# Patient Record
Sex: Female | Born: 1985 | Race: White | Hispanic: No | Marital: Single | Smoking: Never smoker
Health system: Southern US, Community
[De-identification: ages and names within clinical notes are randomized; demographics above are authoritative.]

## PROBLEM LIST (undated history)

## (undated) ENCOUNTER — Inpatient Hospital Stay: Payer: Self-pay

## (undated) DIAGNOSIS — F419 Anxiety disorder, unspecified: Secondary | ICD-10-CM

## (undated) HISTORY — PX: CHOLECYSTECTOMY: SHX55

---

## 2006-12-02 ENCOUNTER — Emergency Department: Payer: Self-pay | Admitting: Emergency Medicine

## 2007-01-28 ENCOUNTER — Emergency Department: Payer: Self-pay | Admitting: Emergency Medicine

## 2007-01-28 ENCOUNTER — Other Ambulatory Visit: Payer: Self-pay

## 2007-03-05 ENCOUNTER — Emergency Department: Payer: Self-pay

## 2009-01-27 ENCOUNTER — Emergency Department: Payer: Self-pay | Admitting: Emergency Medicine

## 2009-04-26 ENCOUNTER — Emergency Department: Payer: Self-pay | Admitting: Emergency Medicine

## 2009-04-29 ENCOUNTER — Emergency Department: Payer: Self-pay | Admitting: Internal Medicine

## 2010-08-14 ENCOUNTER — Emergency Department: Payer: Self-pay | Admitting: Emergency Medicine

## 2011-04-14 ENCOUNTER — Ambulatory Visit: Payer: Self-pay | Admitting: Family Medicine

## 2011-11-11 ENCOUNTER — Emergency Department: Payer: Self-pay | Admitting: Unknown Physician Specialty

## 2011-12-23 ENCOUNTER — Emergency Department: Payer: Self-pay | Admitting: Emergency Medicine

## 2012-03-15 ENCOUNTER — Emergency Department: Payer: Self-pay | Admitting: Emergency Medicine

## 2012-03-15 LAB — CBC
HGB: 13.5 g/dL (ref 12.0–16.0)
MCHC: 34.5 g/dL (ref 32.0–36.0)
MCV: 88 fL (ref 80–100)
Platelet: 427 10*3/uL (ref 150–440)
RBC: 4.42 10*6/uL (ref 3.80–5.20)
RDW: 12.4 % (ref 11.5–14.5)
WBC: 18.1 10*3/uL — ABNORMAL HIGH (ref 3.6–11.0)

## 2012-03-15 LAB — URINALYSIS, COMPLETE
Ketone: NEGATIVE
Nitrite: NEGATIVE
Specific Gravity: 1.013 (ref 1.003–1.030)
Squamous Epithelial: 45
WBC UR: 54 /HPF (ref 0–5)

## 2012-03-15 LAB — PREGNANCY, URINE: Pregnancy Test, Urine: POSITIVE m[IU]/mL

## 2012-03-15 LAB — COMPREHENSIVE METABOLIC PANEL
BUN: 5 mg/dL — ABNORMAL LOW (ref 7–18)
Bilirubin,Total: 0.7 mg/dL (ref 0.2–1.0)
Calcium, Total: 9.5 mg/dL (ref 8.5–10.1)
Chloride: 103 mmol/L (ref 98–107)
Creatinine: 0.54 mg/dL — ABNORMAL LOW (ref 0.60–1.30)
EGFR (African American): >60
EGFR (Non-African Amer.): >60
Osmolality: 271 (ref 275–301)

## 2012-03-15 LAB — HCG, QUANTITATIVE, PREGNANCY: Beta Hcg, Quant.: 97819 m[IU]/mL — ABNORMAL HIGH

## 2012-04-28 ENCOUNTER — Emergency Department: Payer: Self-pay | Admitting: *Deleted

## 2012-04-28 LAB — URINALYSIS, COMPLETE
Ketone: NEGATIVE
Nitrite: NEGATIVE
Ph: 8 (ref 4.5–8.0)
Protein: 30
RBC,UR: 3 /HPF (ref 0–5)
Specific Gravity: 1.019 (ref 1.003–1.030)
WBC UR: 78 /HPF (ref 0–5)

## 2012-04-29 LAB — CBC
HGB: 11.7 g/dL — ABNORMAL LOW (ref 12.0–16.0)
MCH: 30.4 pg (ref 26.0–34.0)
RDW: 12.5 % (ref 11.5–14.5)

## 2012-04-29 LAB — WET PREP, GENITAL

## 2012-04-30 LAB — URINE CULTURE

## 2012-05-03 ENCOUNTER — Emergency Department: Payer: Self-pay | Admitting: Emergency Medicine

## 2012-05-03 LAB — URINALYSIS, COMPLETE
Bacteria: NONE SEEN
Bilirubin,UR: NEGATIVE
Blood: NEGATIVE
Glucose,UR: NEGATIVE mg/dL (ref 0–75)
Leukocyte Esterase: NEGATIVE
Nitrite: NEGATIVE
Ph: 8 (ref 4.5–8.0)
Protein: 30
RBC,UR: 1 /HPF (ref 0–5)
Specific Gravity: 1.023 (ref 1.003–1.030)
Squamous Epithelial: NONE SEEN

## 2012-05-03 LAB — CBC
HCT: 36.8 % (ref 35.0–47.0)
MCH: 29.9 pg (ref 26.0–34.0)
MCHC: 33.3 g/dL (ref 32.0–36.0)
MCV: 90 fL (ref 80–100)
Platelet: 331 10*3/uL (ref 150–440)
RBC: 4.09 10*6/uL (ref 3.80–5.20)
RDW: 12.6 % (ref 11.5–14.5)
WBC: 27.9 10*3/uL — ABNORMAL HIGH (ref 3.6–11.0)

## 2012-05-03 LAB — COMPREHENSIVE METABOLIC PANEL
Alkaline Phosphatase: 101 U/L (ref 50–136)
Anion Gap: 11 (ref 7–16)
BUN: 5 mg/dL — ABNORMAL LOW (ref 7–18)
Chloride: 102 mmol/L (ref 98–107)
Glucose: 112 mg/dL — ABNORMAL HIGH (ref 65–99)
Osmolality: 274 (ref 275–301)
Potassium: 3.1 mmol/L — ABNORMAL LOW (ref 3.5–5.1)
SGOT(AST): 17 U/L (ref 15–37)
Sodium: 138 mmol/L (ref 136–145)
Total Protein: 7.9 g/dL (ref 6.4–8.2)

## 2012-05-03 LAB — HCG, QUANTITATIVE, PREGNANCY: Beta Hcg, Quant.: 22082 m[IU]/mL — ABNORMAL HIGH

## 2012-05-05 LAB — PATHOLOGY REPORT

## 2013-12-12 ENCOUNTER — Emergency Department: Payer: Self-pay | Admitting: Emergency Medicine

## 2014-02-17 ENCOUNTER — Emergency Department: Payer: Self-pay | Admitting: Emergency Medicine

## 2014-02-17 LAB — CBC WITH DIFFERENTIAL/PLATELET
Basophil #: 0.1 10*3/uL (ref 0.0–0.1)
Basophil %: 0.9 %
EOS ABS: 0.1 10*3/uL (ref 0.0–0.7)
Eosinophil %: 0.6 %
HCT: 38.8 % (ref 35.0–47.0)
HGB: 13.3 g/dL (ref 12.0–16.0)
LYMPHS ABS: 2.9 10*3/uL (ref 1.0–3.6)
Lymphocyte %: 18.6 %
MCH: 29.7 pg (ref 26.0–34.0)
MCHC: 34.2 g/dL (ref 32.0–36.0)
MCV: 87 fL (ref 80–100)
MONO ABS: 0.8 x10 3/mm (ref 0.2–0.9)
Monocyte %: 4.9 %
NEUTROS ABS: 11.6 10*3/uL — AB (ref 1.4–6.5)
NEUTROS PCT: 75 %
PLATELETS: 407 10*3/uL (ref 150–440)
RBC: 4.48 10*6/uL (ref 3.80–5.20)
RDW: 13 % (ref 11.5–14.5)
WBC: 15.4 10*3/uL — AB (ref 3.6–11.0)

## 2014-02-17 LAB — URINALYSIS, COMPLETE
Bacteria: NONE SEEN
Bilirubin,UR: NEGATIVE
GLUCOSE, UR: NEGATIVE mg/dL (ref 0–75)
Ketone: NEGATIVE
Leukocyte Esterase: NEGATIVE
NITRITE: NEGATIVE
Ph: 6 (ref 4.5–8.0)
Protein: NEGATIVE
RBC,UR: 1 /HPF (ref 0–5)
Specific Gravity: 1.019 (ref 1.003–1.030)
WBC UR: 3 /HPF (ref 0–5)

## 2014-02-17 LAB — PREGNANCY, URINE: Pregnancy Test, Urine: NEGATIVE m[IU]/mL

## 2014-02-17 LAB — BASIC METABOLIC PANEL
Anion Gap: 5 — ABNORMAL LOW (ref 7–16)
BUN: 8 mg/dL (ref 7–18)
CO2: 30 mmol/L (ref 21–32)
CREATININE: 0.71 mg/dL (ref 0.60–1.30)
Calcium, Total: 9 mg/dL (ref 8.5–10.1)
Chloride: 104 mmol/L (ref 98–107)
EGFR (African American): >60
EGFR (Non-African Amer.): >60
Glucose: 107 mg/dL — ABNORMAL HIGH (ref 65–99)
OSMOLALITY: 276 (ref 275–301)
POTASSIUM: 3.5 mmol/L (ref 3.5–5.1)
Sodium: 139 mmol/L (ref 136–145)

## 2014-02-17 LAB — GC/CHLAMYDIA PROBE AMP

## 2014-02-17 LAB — WET PREP, GENITAL

## 2014-04-13 ENCOUNTER — Emergency Department: Payer: Self-pay | Admitting: Emergency Medicine

## 2014-06-26 ENCOUNTER — Emergency Department: Payer: Self-pay | Admitting: Emergency Medicine

## 2014-06-26 LAB — URINALYSIS, COMPLETE
BACTERIA: NONE SEEN
BILIRUBIN, UR: NEGATIVE
Blood: NEGATIVE
Glucose,UR: NEGATIVE mg/dL (ref 0–75)
Ketone: NEGATIVE
Leukocyte Esterase: NEGATIVE
Nitrite: NEGATIVE
PH: 5 (ref 4.5–8.0)
Protein: 30
RBC,UR: 2 /HPF (ref 0–5)
Specific Gravity: 1.035 (ref 1.003–1.030)
Squamous Epithelial: 17
WBC UR: 1 /HPF (ref 0–5)

## 2014-06-28 LAB — URINE CULTURE

## 2014-06-29 ENCOUNTER — Emergency Department: Payer: Self-pay | Admitting: Emergency Medicine

## 2014-06-29 LAB — COMPREHENSIVE METABOLIC PANEL
ALBUMIN: 3.7 g/dL (ref 3.4–5.0)
ALK PHOS: 64 U/L
ANION GAP: 10 (ref 7–16)
BUN: 8 mg/dL (ref 7–18)
Bilirubin,Total: 0.7 mg/dL (ref 0.2–1.0)
CALCIUM: 9.7 mg/dL (ref 8.5–10.1)
CHLORIDE: 103 mmol/L (ref 98–107)
Co2: 23 mmol/L (ref 21–32)
Creatinine: 0.65 mg/dL (ref 0.60–1.30)
EGFR (African American): >60
GLUCOSE: 100 mg/dL — AB (ref 65–99)
OSMOLALITY: 270 (ref 275–301)
Potassium: 3.5 mmol/L (ref 3.5–5.1)
SGOT(AST): 14 U/L — ABNORMAL LOW (ref 15–37)
SGPT (ALT): 20 U/L
Sodium: 136 mmol/L (ref 136–145)
TOTAL PROTEIN: 8 g/dL (ref 6.4–8.2)

## 2014-06-29 LAB — URINALYSIS, COMPLETE
BILIRUBIN, UR: NEGATIVE
Blood: NEGATIVE
Glucose,UR: NEGATIVE mg/dL (ref 0–75)
Nitrite: NEGATIVE
PH: 7 (ref 4.5–8.0)
RBC,UR: 5 /HPF (ref 0–5)
SPECIFIC GRAVITY: 1.025 (ref 1.003–1.030)

## 2014-06-29 LAB — CBC
HCT: 37.5 % (ref 35.0–47.0)
HGB: 12.8 g/dL (ref 12.0–16.0)
MCH: 30 pg (ref 26.0–34.0)
MCHC: 34 g/dL (ref 32.0–36.0)
MCV: 88 fL (ref 80–100)
PLATELETS: 393 10*3/uL (ref 150–440)
RBC: 4.25 10*6/uL (ref 3.80–5.20)
RDW: 12.8 % (ref 11.5–14.5)
WBC: 15.5 10*3/uL — AB (ref 3.6–11.0)

## 2014-06-29 LAB — GC/CHLAMYDIA PROBE AMP

## 2014-06-29 LAB — WET PREP, GENITAL

## 2014-06-29 LAB — LIPASE, BLOOD: Lipase: 110 U/L (ref 73–393)

## 2014-06-29 LAB — HCG, QUANTITATIVE, PREGNANCY: BETA HCG, QUANT.: 13907 m[IU]/mL — AB

## 2014-07-01 LAB — URINE CULTURE

## 2014-07-23 ENCOUNTER — Emergency Department: Payer: Self-pay | Admitting: Emergency Medicine

## 2014-07-23 ENCOUNTER — Observation Stay: Payer: Self-pay

## 2014-07-23 LAB — COMPREHENSIVE METABOLIC PANEL
ALK PHOS: 70 U/L
ALK PHOS: 68 U/L
ALT: 17 U/L
AST: 13 U/L — AB (ref 15–37)
AST: 30 U/L (ref 15–37)
Albumin: 3.6 g/dL (ref 3.4–5.0)
Albumin: 3.6 g/dL (ref 3.4–5.0)
Anion Gap: 11 (ref 7–16)
Anion Gap: 14 (ref 7–16)
BUN: 6 mg/dL — ABNORMAL LOW (ref 7–18)
BUN: 9 mg/dL (ref 7–18)
Bilirubin,Total: 1 mg/dL (ref 0.2–1.0)
Bilirubin,Total: 1.2 mg/dL — ABNORMAL HIGH (ref 0.2–1.0)
CALCIUM: 9.6 mg/dL (ref 8.5–10.1)
CHLORIDE: 101 mmol/L (ref 98–107)
CHLORIDE: 101 mmol/L (ref 98–107)
CO2: 19 mmol/L — AB (ref 21–32)
CO2: 19 mmol/L — AB (ref 21–32)
Calcium, Total: 9.7 mg/dL (ref 8.5–10.1)
Creatinine: 0.47 mg/dL — ABNORMAL LOW (ref 0.60–1.30)
Creatinine: 0.66 mg/dL (ref 0.60–1.30)
EGFR (African American): >60
EGFR (Non-African Amer.): >60
GLUCOSE: 104 mg/dL — AB (ref 65–99)
Glucose: 103 mg/dL — ABNORMAL HIGH (ref 65–99)
OSMOLALITY: 261 (ref 275–301)
Osmolality: 267 (ref 275–301)
POTASSIUM: 3.6 mmol/L (ref 3.5–5.1)
POTASSIUM: 2.8 mmol/L — AB (ref 3.5–5.1)
SGPT (ALT): 17 U/L
Sodium: 131 mmol/L — ABNORMAL LOW (ref 136–145)
Sodium: 134 mmol/L — ABNORMAL LOW (ref 136–145)
TOTAL PROTEIN: 8.2 g/dL (ref 6.4–8.2)
Total Protein: 8.2 g/dL (ref 6.4–8.2)

## 2014-07-23 LAB — URINALYSIS, COMPLETE
Bacteria: NONE SEEN
GLUCOSE, UR: NEGATIVE mg/dL (ref 0–75)
Nitrite: NEGATIVE
Ph: 6 (ref 4.5–8.0)
SPECIFIC GRAVITY: 1.031 (ref 1.003–1.030)
WBC UR: 6 /HPF (ref 0–5)

## 2014-07-23 LAB — CBC
HCT: 39.5 % (ref 35.0–47.0)
HGB: 13.2 g/dL (ref 12.0–16.0)
MCH: 29.8 pg (ref 26.0–34.0)
MCHC: 33.3 g/dL (ref 32.0–36.0)
MCV: 90 fL (ref 80–100)
Platelet: 422 10*3/uL (ref 150–440)
RBC: 4.42 10*6/uL (ref 3.80–5.20)
RDW: 13.1 % (ref 11.5–14.5)
WBC: 18.1 10*3/uL — AB (ref 3.6–11.0)

## 2014-07-23 LAB — CBC WITH DIFFERENTIAL/PLATELET
BASOS PCT: 0.3 %
Basophil #: 0 10*3/uL (ref 0.0–0.1)
Eosinophil #: 0 10*3/uL (ref 0.0–0.7)
Eosinophil %: 0.1 %
HCT: 37.7 % (ref 35.0–47.0)
HGB: 12.8 g/dL (ref 12.0–16.0)
LYMPHS ABS: 0.8 10*3/uL — AB (ref 1.0–3.6)
Lymphocyte %: 4.6 %
MCH: 30.2 pg (ref 26.0–34.0)
MCHC: 33.8 g/dL (ref 32.0–36.0)
MCV: 89 fL (ref 80–100)
Monocyte #: 0.4 x10 3/mm (ref 0.2–0.9)
Monocyte %: 2.2 %
Neutrophil #: 15.3 10*3/uL — ABNORMAL HIGH (ref 1.4–6.5)
Neutrophil %: 92.8 %
Platelet: 398 10*3/uL (ref 150–440)
RBC: 4.23 10*6/uL (ref 3.80–5.20)
RDW: 12.7 % (ref 11.5–14.5)
WBC: 16.5 10*3/uL — ABNORMAL HIGH (ref 3.6–11.0)

## 2014-07-23 LAB — HCG, QUANTITATIVE, PREGNANCY: Beta Hcg, Quant.: 117853 m[IU]/mL — ABNORMAL HIGH

## 2014-07-24 LAB — ELECTROLYTE PANEL
Anion Gap: 12 (ref 7–16)
Chloride: 107 mmol/L (ref 98–107)
Co2: 16 mmol/L — ABNORMAL LOW (ref 21–32)
Potassium: 3.5 mmol/L (ref 3.5–5.1)
Sodium: 135 mmol/L — ABNORMAL LOW (ref 136–145)

## 2014-07-24 LAB — TSH: Thyroid Stimulating Horm: 0.149 u[IU]/mL — ABNORMAL LOW

## 2014-07-24 LAB — BUN: BUN: 5 mg/dL — ABNORMAL LOW (ref 7–18)

## 2014-07-24 LAB — CREATININE, SERUM
CREATININE: 0.49 mg/dL — AB (ref 0.60–1.30)
EGFR (African American): >60

## 2014-07-24 LAB — T4, FREE: Free Thyroxine: 1.45 ng/dL

## 2014-12-03 ENCOUNTER — Emergency Department: Payer: Self-pay | Admitting: Emergency Medicine

## 2014-12-12 ENCOUNTER — Emergency Department: Payer: Self-pay | Admitting: Student

## 2015-01-21 ENCOUNTER — Emergency Department: Payer: Self-pay | Admitting: Emergency Medicine

## 2015-02-20 ENCOUNTER — Emergency Department
Admit: 2015-02-20 | Disposition: A | Discharge status: Home / Self Care | Payer: Self-pay | Admitting: Emergency Medicine

## 2015-03-08 ENCOUNTER — Emergency Department
Admit: 2015-03-08 | Disposition: A | Discharge status: Home / Self Care | Payer: Self-pay | Admitting: Emergency Medicine

## 2015-04-14 ENCOUNTER — Encounter: Payer: Self-pay | Admitting: Urgent Care

## 2015-04-14 DIAGNOSIS — O200 Threatened abortion: Secondary | ICD-10-CM | POA: Insufficient documentation

## 2015-04-14 DIAGNOSIS — Z3A00 Weeks of gestation of pregnancy not specified: Secondary | ICD-10-CM | POA: Insufficient documentation

## 2015-04-14 LAB — URINALYSIS COMPLETE WITH MICROSCOPIC (ARMC ONLY)
Bacteria, UA: NONE SEEN
Bilirubin Urine: NEGATIVE
GLUCOSE, UA: NEGATIVE mg/dL
Nitrite: NEGATIVE
PROTEIN: NEGATIVE mg/dL
SPECIFIC GRAVITY, URINE: 1.026 (ref 1.005–1.030)
pH: 6 (ref 5.0–8.0)

## 2015-04-14 LAB — ABO/RH
ABO/RH(D): A POS
ABO/RH(D): A POS

## 2015-04-14 LAB — CBC
HEMATOCRIT: 38.1 % (ref 35.0–47.0)
Hemoglobin: 13 g/dL (ref 12.0–16.0)
MCH: 30.3 pg (ref 26.0–34.0)
MCHC: 34.1 g/dL (ref 32.0–36.0)
MCV: 88.7 fL (ref 80.0–100.0)
PLATELETS: 370 10*3/uL (ref 150–440)
RBC: 4.3 MIL/uL (ref 3.80–5.20)
RDW: 12.7 % (ref 11.5–14.5)
WBC: 13.4 10*3/uL — ABNORMAL HIGH (ref 3.6–11.0)

## 2015-04-14 LAB — POCT PREGNANCY, URINE: PREG TEST UR: POSITIVE — AB

## 2015-04-14 NOTE — ED Notes (Signed)
Patient presents with c/o vaginal bleeding since around 1800 tonight. Patient with recent (+) home preg test. (+) diffuse abd cramping with (+) N/V reported.

## 2015-04-15 ENCOUNTER — Emergency Department: Payer: Self-pay

## 2015-04-15 ENCOUNTER — Emergency Department
Admission: EM | Admit: 2015-04-15 | Discharge: 2015-04-15 | Disposition: A | Discharge status: Home / Self Care | Payer: Self-pay | Attending: Emergency Medicine | Admitting: Emergency Medicine

## 2015-04-15 DIAGNOSIS — O200 Threatened abortion: Secondary | ICD-10-CM

## 2015-04-15 DIAGNOSIS — Z8742 Personal history of other diseases of the female genital tract: Secondary | ICD-10-CM

## 2015-04-15 DIAGNOSIS — O020 Blighted ovum and nonhydatidiform mole: Secondary | ICD-10-CM

## 2015-04-15 LAB — CHLAMYDIA/NGC RT PCR (ARMC ONLY)
Chlamydia Tr: NOT DETECTED
N gonorrhoeae: NOT DETECTED

## 2015-04-15 LAB — WET PREP, GENITAL
Clue Cells Wet Prep HPF POC: NONE SEEN
Trich, Wet Prep: NONE SEEN
YEAST WET PREP: NONE SEEN

## 2015-04-15 LAB — HCG, QUANTITATIVE, PREGNANCY: HCG, BETA CHAIN, QUANT, S: 12207 m[IU]/mL — AB (ref <5)

## 2015-04-15 NOTE — ED Notes (Signed)
Pt reports vag bleeding x 9 hours with abd cramping and positive pregnancy test.  Pt reports vag bleeding light.  Pt's cramping intermittent.  Pt NAD at this time.

## 2015-04-15 NOTE — Discharge Instructions (Signed)
Threatened Miscarriage A threatened miscarriage occurs when you have vaginal bleeding during your first 20 weeks of pregnancy but the pregnancy has not ended. If you have vaginal bleeding during this time, your health care provider will do tests to make sure you are still pregnant. If the tests show you are still pregnant and the developing baby (fetus) inside your womb (uterus) is still growing, your condition is considered a threatened miscarriage. A threatened miscarriage does not mean your pregnancy will end, but it does increase the risk of losing your pregnancy (complete miscarriage). CAUSES  The cause of a threatened miscarriage is usually not known. If you go on to have a complete miscarriage, the most common cause is an abnormal number of chromosomes in the developing baby. Chromosomes are the structures inside cells that hold all your genetic material. Some causes of vaginal bleeding that do not result in miscarriage include:  Having sex.  Having an infection.  Normal hormone changes of pregnancy.  Bleeding that occurs when an egg implants in your uterus. RISK FACTORS Risk factors for bleeding in early pregnancy include:  Obesity.  Smoking.  Drinking excessive amounts of alcohol or caffeine.  Recreational drug use. SIGNS AND SYMPTOMS  Light vaginal bleeding.  Mild abdominal pain or cramps. DIAGNOSIS  If you have bleeding with or without abdominal pain before 20 weeks of pregnancy, your health care provider will do tests to check whether you are still pregnant. One important test involves using sound waves and a computer (ultrasound) to create images of the inside of your uterus. Other tests include an internal exam of your vagina and uterus (pelvic exam) and measurement of your baby's heart rate.  You may be diagnosed with a threatened miscarriage if:  Ultrasound testing shows you are still pregnant.  Your baby's heart rate is strong.  A pelvic exam shows that the  opening between your uterus and your vagina (cervix) is closed.  Your heart rate and blood pressure are stable.  Blood tests confirm you are still pregnant. TREATMENT  No treatments have been shown to prevent a threatened miscarriage from going on to a complete miscarriage. However, the right home care is important.  HOME CARE INSTRUCTIONS   Make sure you keep all your appointments for prenatal care. This is very important.  Get plenty of rest.  Do not have sex or use tampons if you have vaginal bleeding.  Do not douche.  Do not smoke or use recreational drugs.  Do not drink alcohol.  Avoid caffeine. SEEK MEDICAL CARE IF:  You have light vaginal bleeding or spotting while pregnant.  You have abdominal pain or cramping.  You have a fever. SEEK IMMEDIATE MEDICAL CARE IF:  You have heavy vaginal bleeding.  You have blood clots coming from your vagina.  You have severe low back pain or abdominal cramps.  You have fever, chills, and severe abdominal pain. MAKE SURE YOU:  Understand these instructions.  Will watch your condition.  Will get help right away if you are not doing well or get worse. Document Released: 11/08/2005 Document Revised: 11/13/2013 Document Reviewed: 09/04/2013 [facility] Patient Information 2015 [place], [place]. This information is not intended to replace advice given to you by your health care provider. Make sure you discuss any questions you have with your health care provider.  Blighted Ovum A blighted ovum (anembryonic pregnancy) happens when a fertilized egg (embryo) attaches itself to the uterine wall, but the embryo does not develop. The pregnancy sac (placenta) continues to grow  even though the embryo does not grow and develop.The pregnancy hormone is still secreted because the placenta has formed. This will result in a positive pregnancy test despite having an abnormal pregnancy. A blighted ovum occurs within the first trimester, sometimes  before a woman knows she is pregnant.  CAUSES A blighted ovum is usually the result of chromosomal problems. This can be caused by abnormal cell division or poor quality sperm or egg. SYMPTOMS Early on, signs of pregnancy may be experienced, such as:  A missed menstrual period.  Fatigue.  Feeling sick to your stomach (nauseous).  Sore breasts.  A positive pregnancy test. Then, signs of miscarriage may develop, such as:  Abdominal cramps.  Vaginal bleeding or spotting.  A menstrual period that is heavier than usual. DIAGNOSIS The diagnosis of a blighted ovum is made with an ultrasound test that shows an empty uterus or an empty gestational sac. TREATMENT Your caregiver will help you decide what the best treatment is for you. Treatment for a blighted ovum includes:   Letting your body naturally pass the tissue of a blighted ovum.  Taking medicine to trigger the miscarriage.  Having a procedure called a dilation and curettage (D&C) to remove the placental tissues.  A D&C may be helpful if you would like the tissue examined to determine the reason for a miscarriage. Talk to your caregiver about the risks involved with this procedure. HOME CARE INSTRUCTIONS   Follow up with your caregiver to make sure that your pregnancy hormone returns to zero.  Wait at least 1 to 3 regular menstrual cycles before trying to get pregnant again, or as recommended by your caregiver. SEEK IMMEDIATE MEDICAL CARE IF:  You have worsening abdominal pain.  You have very heavy bleeding or use 1 to 2 pads every hour, for more than 2 hours.  You are dizzy, feel faint, or pass out. Document Released: 02/23/2011 Document Revised: 01/31/2012 Document Reviewed: 02/23/2011 [facility] Patient Information 2015 [place]. This information is not intended to replace advice given to you by your health care provider. Make sure you discuss any questions you have with your health care provider.

## 2015-04-15 NOTE — ED Provider Notes (Signed)
[facility] Provider Note  ____________________________________________  Time seen: Approximately 317 AM  I have reviewed the triage vital signs and the nursing notes.   HISTORY  Chief Complaint Vaginal Bleeding    HPI Anita Campbell is a 29 y.o. female who comes in today with vaginal bleeding. The patient reports started having some abdominal cramping and bleeding around 1800. The patient reports that she had a positive pregnancy test 2 days ago and was concerned about a possible miscarriage. The patient reports that she has miscarried her last 2 pregnancies and this is her fourth pregnancy. The patient was concerned so she came in to be evaluated. The patient has not been seen for this pregnancy yet. The patient planned to go [facility] the [facility] department for her obstetric care. The patient is a G4 P1021. The patient reports currently that the bleeding has stopped. She took Tylenol for the pain earlier. The patient reports that her pain is currently a 5 out of 10 in intensity.   History reviewed. No pertinent past medical history.  There are no active problems to display for this patient.   Past Surgical History  Procedure Laterality Date  . Cholecystectomy    . Cesarean section      No current outpatient prescriptions on file.  Allergies Review of patient's allergies indicates no known allergies.  No family history on file.  Social History History  Substance Use Topics  . Smoking status: Never Smoker   . Smokeless tobacco: Not on file  . Alcohol Use: No    Review of Systems Constitutional: No fever/chills Eyes: No visual changes. ENT: No sore throat. Cardiovascular: Denies chest pain. Respiratory: Denies shortness of breath. Gastrointestinal: No abdominal pain.  No nausea, no vomiting.   Genitourinary: Negative for dysuria. Musculoskeletal: Negative for back pain. Skin: Negative for rash. Neurological: Negative for  headaches, focal weakness or numbness.  10-point ROS otherwise negative.  ____________________________________________   PHYSICAL EXAM:  VITAL SIGNS: ED Triage Vitals  Enc Vitals Group     BP 04/14/15 2305 153/87 mmHg     Pulse Rate 04/14/15 2305 58     Resp 04/14/15 2305 16     Temp 04/14/15 2305 97.6 F (36.4 C)     Temp Source 04/14/15 2305 Oral     SpO2 04/14/15 2305 100 %     Weight 04/14/15 2305 130 lb (58.968 kg)     Height 04/14/15 2305 5' (1.524 m)     Head Cir --      Peak Flow --      Pain Score 04/14/15 2306 7     Pain Loc --      Pain Edu? --      Excl. in GC? --     Constitutional: Alert and oriented. Well appearing and in no acute distress. Eyes: Conjunctivae are normal. PERRL. EOMI. Head: Atraumatic. Nose: No congestion/rhinnorhea. Mouth/Throat: Mucous membranes are moist.  Oropharynx non-erythematous. Cardiovascular: Normal rate, regular rhythm. Grossly normal heart sounds.  Good peripheral circulation. Respiratory: Normal respiratory effort.  No retractions. Lungs CTAB. Gastrointestinal: Soft and nontender. No distention. Positive bowel sounds Genitourinary: Normal external genitalia no blood in the vaginal vault cervix is closed trace blood in the cervical os. No adnexal or uterine tenderness to palpation Musculoskeletal: No lower extremity tenderness nor edema.  No joint effusions. Neurologic:  Normal speech and language. No gross focal neurologic deficits are appreciated.  Skin:  Skin is warm, dry and intact. No rash noted. Psychiatric:  Mood and affect are normal.   ____________________________________________   LABS (all labs ordered are listed, but only abnormal results are displayed)  Labs Reviewed  WET PREP, GENITAL - Abnormal; Notable for the following:    WBC, Wet Prep HPF POC MANY (*)    All other components within normal limits  CBC - Abnormal; Notable for the following:    WBC 13.4 (*)    All other components within normal limits   HCG, QUANTITATIVE, PREGNANCY - Abnormal; Notable for the following:    hCG, Beta Chain, Quant, S 12207 (*)    All other components within normal limits  URINALYSIS COMPLETEWITH MICROSCOPIC ([facility])  - Abnormal; Notable for the following:    Color, Urine YELLOW (*)    APPearance CLEAR (*)    Ketones, ur TRACE (*)    Hgb urine dipstick 1+ (*)    Leukocytes, UA TRACE (*)    Squamous Epithelial / LPF 0-5 (*)    All other components within normal limits  POCT PREGNANCY, URINE - Abnormal; Notable for the following:    Preg Test, Ur POSITIVE (*)    All other components within normal limits  CHLAMYDIA/NGC RT PCR ([facility])   POC URINE PREG, ED  ABO/RH  ABO/RH   ____________________________________________  EKG  None ____________________________________________  RADIOLOGY  Ultrasound: MC intrauterine gestational sac suspicious for non-viability, recommend serial beta hCG and follow-up pelvic ultrasound. Small subchorionic hemorrhage. ____________________________________________   PROCEDURES  Procedure(s) performed: None  Critical Care performed: No  ____________________________________________   INITIAL IMPRESSION / ASSESSMENT AND PLAN / ED COURSE  Pertinent labs & imaging results that were available during my care of the patient were reviewed by me and considered in my medical decision making (see chart for details).  This is a 29 year old female who comes in with vaginal bleeding after having a positive pregnancy test a few days ago. The patient's ultrasound shows an empty gestational sac but the patient does need to follow back up with OB/GYN. The patient is a positive blood type so she does not need RhoGAM at this time. We will await the patient's wet prep results to determine if she has any vaginal infections and disposition the patient appropriately at that time.  The patient's wet prep was negative. She will be discharged to follow-up with OB/GYN as an  outpatient. ____________________________________________   FINAL CLINICAL IMPRESSION(S) / ED DIAGNOSES  Final diagnoses:  Threatened abortion  Empty gestational sac with ongoing pregnancy      Rebecka Apley, MD 04/15/15 [phone]

## 2015-06-10 ENCOUNTER — Emergency Department
Admission: EM | Admit: 2015-06-10 | Discharge: 2015-06-10 | Disposition: A | Discharge status: Home / Self Care | Payer: Self-pay | Attending: Emergency Medicine | Admitting: Emergency Medicine

## 2015-06-10 DIAGNOSIS — R112 Nausea with vomiting, unspecified: Secondary | ICD-10-CM | POA: Insufficient documentation

## 2015-06-10 DIAGNOSIS — F419 Anxiety disorder, unspecified: Secondary | ICD-10-CM | POA: Insufficient documentation

## 2015-06-10 HISTORY — DX: Anxiety disorder, unspecified: F41.9

## 2015-06-10 MED ORDER — SODIUM CHLORIDE 0.9 % IV SOLN
Freq: Once | INTRAVENOUS | Status: AC
  Administered 2015-06-10: 10:00:00 via INTRAVENOUS

## 2015-06-10 MED ORDER — PROMETHAZINE HCL 25 MG PO TABS: 25.0000 mg | ORAL_TABLET | Freq: Four times a day (QID) | ORAL | Status: DC | PRN

## 2015-06-10 MED ORDER — LORAZEPAM 2 MG/ML IJ SOLN
1.0000 mg | Freq: Once | INTRAMUSCULAR | Status: AC
  Administered 2015-06-10: 1 mg via INTRAVENOUS
  Filled 2015-06-10: qty 1

## 2015-06-10 MED ORDER — PROMETHAZINE HCL 25 MG/ML IJ SOLN
25.0000 mg | Freq: Once | INTRAMUSCULAR | Status: AC
  Administered 2015-06-10: 25 mg via INTRAVENOUS
  Filled 2015-06-10: qty 1

## 2015-06-10 MED ORDER — ONDANSETRON HCL 4 MG/2ML IJ SOLN
4.0000 mg | Freq: Once | INTRAMUSCULAR | Status: AC
  Administered 2015-06-10: 4 mg via INTRAVENOUS
  Filled 2015-06-10: qty 2

## 2015-06-10 NOTE — ED Provider Notes (Signed)
[facility] Emergency Department Provider Note     Time seen: ----------------------------------------- 9:51 AM on 06/10/2015 -----------------------------------------    I have reviewed the triage vital signs and the nursing notes.   HISTORY  Chief Complaint No chief complaint on file.    HPI Anita Campbell is a 29 y.o. female brought the ER by EMS for breathing difficulty, anxiety and vomiting. Patient reports she was scared. Symptoms started over 300 this morning with intense vomiting and inability to keep anything down. Patient does report a history of anxiety, states she is on her menstrual cycle. States this is happened to her before. Some moderate to severe this time.   No past medical history on file.  There are no active problems to display for this patient.   Past Surgical History  Procedure Laterality Date  . Cholecystectomy    . Cesarean section      Allergies Review of patient's allergies indicates no known allergies.  Social History History  Substance Use Topics  . Smoking status: Never Smoker   . Smokeless tobacco: Not on file  . Alcohol Use: No    Review of Systems Constitutional: Negative for fever. Eyes: Negative for visual changes. ENT: Negative for sore throat. Cardiovascular: Negative for chest pain. Respiratory: Negative for shortness of breath. Gastrointestinal: Negative for abdominal pain, positive for vomiting Genitourinary: Negative for dysuria. Musculoskeletal: Negative for back pain. Skin: Negative for rash. Neurological: Negative for headaches, focal weakness or numbness. Psychiatric: Positive for anxiety  10-point ROS otherwise negative.  ____________________________________________   PHYSICAL EXAM:  VITAL SIGNS: ED Triage Vitals  Enc Vitals Group     BP --      Pulse --      Resp --      Temp --      Temp src --      SpO2 --      Weight --      Height --      Head Cir --      Peak  Flow --      Pain Score --      Pain Loc --      Pain Edu? --      Excl. in GC? --     Constitutional: Alert and oriented. Mild distress, anxious Eyes: Conjunctivae are normal. PERRL. Normal extraocular movements. ENT   Head: Normocephalic and atraumatic.   Nose: No congestion/rhinnorhea.   Mouth/Throat: Mucous membranes are moist.   Neck: No stridor. Hematological/Lymphatic/Immunilogical: No cervical lymphadenopathy. Cardiovascular: Normal rate, regular rhythm. Normal and symmetric distal pulses are present in all extremities. No murmurs, rubs, or gallops. Respiratory: Normal respiratory effort without tachypnea nor retractions. Breath sounds are clear and equal bilaterally. No wheezes/rales/rhonchi. Gastrointestinal: Soft and nontender. No distention. No abdominal bruits. There is no CVA tenderness. Musculoskeletal: Nontender with normal range of motion in all extremities. No joint effusions.  No lower extremity tenderness nor edema. Neurologic:  Normal speech and language. No gross focal neurologic deficits are appreciated. Speech is normal. No gait instability. Skin:  Skin is warm, dry and intact. No rash noted. Psychiatric: Patient tearful, appears very anxious, speech and behavior are normal. ____________________________________________  ED COURSE:  Pertinent labs & imaging results that were available during my care of the patient were reviewed by me and considered in my medical decision making (see chart for details). Patient received saline, Ativan and Zofran. This is likely mostly anxiety related. ____________________________________________    LABS (pertinent positives/negatives)  Labs Reviewed  URINALYSIS COMPLETEWITH MICROSCOPIC ([facility] ONLY)  PREGNANCY, URINE    EKG: Sinus bradycardia with a rate of 49 bpm, otherwise normal axis normal intervals. No evidence of hypertrophy or acute infarction. ____________________________________________  FINAL  ASSESSMENT AND PLAN  Vomiting, anxiety  Plan: Patient with labs and imaging as dictated above. Patient is feeling better, we'll discharge with Phenergan to take as needed for nausea or vomiting. She is stable for outpatient follow-up. Vital signs are currently normal.   Emily FilbertWilliams, Kathleene Bergemann E, MD   Emily FilbertJonathan E Elodia Haviland, MD 06/10/15 1255

## 2015-06-10 NOTE — Discharge Instructions (Signed)

## 2015-06-10 NOTE — ED Notes (Signed)
Patient medicated for nausea. IV infusing without difficulty. Family at bedside. Will continue to monitor.

## 2015-06-10 NOTE — ED Notes (Signed)
Per EMS, pt was driving to ED when she began to feel lightheaded.  Pt c/o n/v since 0300 today.  Pt a/Ox4

## 2015-07-15 ENCOUNTER — Emergency Department: Payer: Self-pay

## 2015-07-15 ENCOUNTER — Emergency Department
Admission: EM | Admit: 2015-07-15 | Discharge: 2015-07-16 | Disposition: A | Discharge status: Home / Self Care | Payer: Self-pay | Attending: Emergency Medicine | Admitting: Emergency Medicine

## 2015-07-15 ENCOUNTER — Encounter: Payer: Self-pay | Admitting: Urgent Care

## 2015-07-15 DIAGNOSIS — O200 Threatened abortion: Secondary | ICD-10-CM | POA: Insufficient documentation

## 2015-07-15 DIAGNOSIS — O4690 Antepartum hemorrhage, unspecified, unspecified trimester: Secondary | ICD-10-CM

## 2015-07-15 DIAGNOSIS — Z3A01 Less than 8 weeks gestation of pregnancy: Secondary | ICD-10-CM | POA: Insufficient documentation

## 2015-07-15 LAB — ABO/RH: ABO/RH(D): A POS

## 2015-07-15 NOTE — ED Notes (Signed)
Patient presents with c/p acute onset abd cramping with vaginal bleeding . Patient is currently pregnant, however she does not know how far along. Patient is a G6-P1-A1e3s.

## 2015-07-15 NOTE — ED Notes (Signed)
Patient transported to Ultrasound 

## 2015-07-15 NOTE — ED Provider Notes (Signed)
[facility] Emergency Department Provider Note  ____________________________________________  Time seen: Approximately 11:46 PM  I have reviewed the triage vital signs and the nursing notes.   HISTORY  Chief Complaint Vaginal Bleeding and Abdominal Cramping    HPI Anita Campbell is a 29 y.o. female who comes in with vaginal bleeding and thinking that she's having a miscarriage. The patient reports that she found out that she was pregnant a few days ago and started bleeding tonight at 9:30. The patient reports that she initially went through 2 pads in an hour so she was concerned. The patient reports now that the bleeding has slowed down and she is only using a pending. The patient reports that she's had some abdominal cramping and her pain as a 7 out of 10 in intensity. The patient is a G5 P1031 and reports that this feels just like her previous miscarriages. The patient reports also that she's had some vomiting and diarrhea today. The patient is concerned but reports that this pregnancy was not planned. Patient is here for further evaluation.   Past Medical History  Diagnosis Date  . Anxiety     There are no active problems to display for this patient.   Past Surgical History  Procedure Laterality Date  . Cholecystectomy    . Cesarean section      No current outpatient prescriptions on file.  Allergies Review of patient's allergies indicates no known allergies.  No family history on file.  Social History Social History  Substance Use Topics  . Smoking status: Never Smoker   . Smokeless tobacco: None  . Alcohol Use: No    Review of Systems Constitutional: No fever/chills Eyes: No visual changes. ENT: No sore throat. Cardiovascular: Denies chest pain. Respiratory: Denies shortness of breath. Gastrointestinal: Abdominal cramping, vomiting, diarrhea Genitourinary: Vaginal bleeding Musculoskeletal: Negative for back pain. Skin: Negative  for rash.  10-point ROS otherwise negative.  ____________________________________________   PHYSICAL EXAM:  VITAL SIGNS: ED Triage Vitals  Enc Vitals Group     BP 07/15/15 2257 160/104 mmHg     Pulse Rate 07/15/15 2257 98     Resp 07/15/15 2257 18     Temp 07/15/15 2257 98.3 F (36.8 C)     Temp Source 07/15/15 2257 Oral     SpO2 07/15/15 2257 100 %     Weight 07/15/15 2257 130 lb (58.968 kg)     Height 07/15/15 2257 4\' 11"  (1.499 m)     Head Cir --      Peak Flow --      Pain Score 07/15/15 2257 8     Pain Loc --      Pain Edu? --      Excl. in GC? --     Constitutional: Alert and oriented. Well appearing and in mild distress. Eyes: Conjunctivae are normal. PERRL. EOMI. Head: Atraumatic. Nose: No congestion/rhinnorhea. Mouth/Throat: Mucous membranes are moist.  Oropharynx non-erythematous. Cardiovascular: Normal rate, regular rhythm. Grossly normal heart sounds.  Good peripheral circulation. Respiratory: Normal respiratory effort.  No retractions. Lungs CTAB. Gastrointestinal: Soft and nontender. No distention. Positive bowel sounds Genitourinary: normal external genitalia, no blood in vault, no CMT, uterine or adnexal tenderness, cervix closed Musculoskeletal: No lower extremity tenderness nor edema.   Neurologic:  Normal speech and language.  Skin:  Skin is warm, dry and intact. No rash noted. Psychiatric: Mood and affect are normal.  ____________________________________________   LABS (all labs ordered are listed, but only abnormal results are  displayed)  Labs Reviewed  WET PREP, GENITAL - Abnormal; Notable for the following:    WBC, Wet Prep HPF POC MANY (*)    All other components within normal limits  HCG, QUANTITATIVE, PREGNANCY - Abnormal; Notable for the following:    hCG, Beta Chain, Quant, S [id number] (*)    All other components within normal limits  CBC - Abnormal; Notable for the following:    WBC 15.2 (*)    All other components within normal limits   CHLAMYDIA/NGC RT PCR ([facility] ONLY)  POC URINE PREG, ED  ABO/RH   ____________________________________________  EKG  none ____________________________________________  RADIOLOGY  Pelvic ultrasound: Probable early intrauterine gestation with yolk sac but no fetal pole or cardiac activity yet visualized. ____________________________________________   PROCEDURES  Procedure(s) performed: None  Critical Care performed: No  ____________________________________________   INITIAL IMPRESSION / ASSESSMENT AND PLAN / ED COURSE  Pertinent labs & imaging results that were available during my care of the patient were reviewed by me and considered in my medical decision making (see chart for details).  This is a 29 year old female with a history of multiple menses who comes in with vaginal bleeding after discovering a positive pregnancy test a few days ago. The patient receive an ultrasound to evaluate for miscarriage as well as a Rh determine if she needs a Rho gam shot.  The patient did not have any significant blood in her vault and her ultrasound does not show a fetal pole. At this time the patient needs to be followed up with OB to have a repeat of her blood work as well as her ultrasound. The patient will be discharged home to follow-up with OB/GYN for further evaluation of her threatened miscarriage. ____________________________________________   FINAL CLINICAL IMPRESSION(S) / ED DIAGNOSES  Final diagnoses:  Threatened miscarriage      Rebecka Apley, MD 07/16/15 [phone]

## 2015-07-15 NOTE — ED Notes (Signed)
URINE PREGNANCY POCT POSITIVE

## 2015-07-16 LAB — CHLAMYDIA/NGC RT PCR (ARMC ONLY)
CHLAMYDIA TR: NOT DETECTED
N gonorrhoeae: NOT DETECTED

## 2015-07-16 LAB — CBC
HEMATOCRIT: 38.3 % (ref 35.0–47.0)
HEMOGLOBIN: 13 g/dL (ref 12.0–16.0)
MCH: 29.2 pg (ref 26.0–34.0)
MCHC: 34 g/dL (ref 32.0–36.0)
MCV: 85.7 fL (ref 80.0–100.0)
Platelets: 415 10*3/uL (ref 150–440)
RBC: 4.47 MIL/uL (ref 3.80–5.20)
RDW: 13.8 % (ref 11.5–14.5)
WBC: 15.2 10*3/uL — ABNORMAL HIGH (ref 3.6–11.0)

## 2015-07-16 LAB — WET PREP, GENITAL
CLUE CELLS WET PREP: NONE SEEN
TRICH WET PREP: NONE SEEN
YEAST WET PREP: NONE SEEN

## 2015-07-16 LAB — HCG, QUANTITATIVE, PREGNANCY: HCG, BETA CHAIN, QUANT, S: 18973 m[IU]/mL — AB (ref <5)

## 2015-07-16 NOTE — Discharge Instructions (Signed)

## 2015-08-25 ENCOUNTER — Emergency Department: Payer: Self-pay

## 2015-08-25 ENCOUNTER — Emergency Department
Admission: EM | Admit: 2015-08-25 | Discharge: 2015-08-26 | Disposition: A | Discharge status: Home / Self Care | Payer: Self-pay | Attending: Emergency Medicine | Admitting: Emergency Medicine

## 2015-08-25 DIAGNOSIS — O200 Threatened abortion: Secondary | ICD-10-CM | POA: Insufficient documentation

## 2015-08-25 DIAGNOSIS — N939 Abnormal uterine and vaginal bleeding, unspecified: Secondary | ICD-10-CM

## 2015-08-25 DIAGNOSIS — Z3A11 11 weeks gestation of pregnancy: Secondary | ICD-10-CM | POA: Insufficient documentation

## 2015-08-25 LAB — BASIC METABOLIC PANEL
ANION GAP: 9 (ref 5–15)
BUN: 8 mg/dL (ref 6–20)
CHLORIDE: 99 mmol/L — AB (ref 101–111)
CO2: 22 mmol/L (ref 22–32)
Calcium: 8.9 mg/dL (ref 8.9–10.3)
Creatinine, Ser: 0.44 mg/dL (ref 0.44–1.00)
GFR calc non Af Amer: >60 mL/min (ref >60)
Glucose, Bld: 90 mg/dL (ref 65–99)
POTASSIUM: 3.4 mmol/L — AB (ref 3.5–5.1)
Sodium: 130 mmol/L — ABNORMAL LOW (ref 135–145)

## 2015-08-25 LAB — CBC WITH DIFFERENTIAL/PLATELET
BASOS ABS: 0.1 10*3/uL (ref 0–0.1)
BASOS PCT: 1 %
Eosinophils Absolute: 0.4 10*3/uL (ref 0–0.7)
Eosinophils Relative: 3 %
HEMATOCRIT: 34.2 % — AB (ref 35.0–47.0)
HEMOGLOBIN: 12.1 g/dL (ref 12.0–16.0)
Lymphocytes Relative: 14 %
Lymphs Abs: 2.2 10*3/uL (ref 1.0–3.6)
MCH: 30.3 pg (ref 26.0–34.0)
MCHC: 35.2 g/dL (ref 32.0–36.0)
MCV: 86 fL (ref 80.0–100.0)
MONOS PCT: 5 %
Monocytes Absolute: 0.7 10*3/uL (ref 0.2–0.9)
NEUTROS ABS: 12.4 10*3/uL — AB (ref 1.4–6.5)
NEUTROS PCT: 77 %
Platelets: 338 10*3/uL (ref 150–440)
RBC: 3.98 MIL/uL (ref 3.80–5.20)
RDW: 13.5 % (ref 11.5–14.5)
WBC: 15.8 10*3/uL — ABNORMAL HIGH (ref 3.6–11.0)

## 2015-08-25 LAB — HCG, QUANTITATIVE, PREGNANCY: hCG, Beta Chain, Quant, S: 98804 m[IU]/mL — ABNORMAL HIGH (ref <5)

## 2015-08-25 MED ORDER — ACETAMINOPHEN 500 MG PO TABS
1000.0000 mg | ORAL_TABLET | Freq: Once | ORAL | Status: AC
  Administered 2015-08-25: 1000 mg via ORAL
  Filled 2015-08-25: qty 2

## 2015-08-25 MED ORDER — MORPHINE SULFATE (PF) 2 MG/ML IV SOLN
2.0000 mg | Freq: Once | INTRAVENOUS | Status: AC
  Administered 2015-08-25: 2 mg via INTRAVENOUS
  Filled 2015-08-25: qty 1

## 2015-08-25 MED ORDER — ONDANSETRON HCL 4 MG/2ML IJ SOLN
4.0000 mg | Freq: Once | INTRAMUSCULAR | Status: AC
  Administered 2015-08-25: 4 mg via INTRAVENOUS
  Filled 2015-08-25: qty 2

## 2015-08-25 NOTE — ED Notes (Signed)
Patient transported to Ultrasound 

## 2015-08-25 NOTE — ED Provider Notes (Signed)
[facility] Emergency Department Provider Note   ____________________________________________  Time seen: 8:15 PM I have reviewed the triage vital signs and the triage nursing note.  HISTORY  Chief Complaint Miscarriage   Historian Patient  HPI Anita Campbell is a 29 y.o. female who is G6 P1 a 4 who is currently pregnant with last menstrual period in June, and 1 episode of vaginal bleeding several weeks ago, who developed pelvic cramping and heavy vaginal bleeding today. This is consistent with her prior miscarriages. 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 Pain is cramping and suprapubic and moderate to severe. Patient states she has anxiety and is feeling extremely anxious right now about a miscarriage.    Past Medical History  Diagnosis Date  . Anxiety     There are no active problems to display for this patient.   Past Surgical History  Procedure Laterality Date  . Cholecystectomy    . Cesarean section      No current outpatient prescriptions on file.  Allergies Review of patient's allergies indicates no known allergies.  No family history on file.  Social History Social History  Substance Use Topics  . Smoking status: Never Smoker   . Smokeless tobacco: None  . Alcohol Use: No    Review of Systems  Constitutional: Negative for fever. Eyes: Negative for visual changes. ENT: Negative for sore throat. Cardiovascular: Negative for chest pain. Respiratory: Negative for shortness of breath. Gastrointestinal: Negative for abdominal pain, vomiting and diarrhea. Genitourinary: Negative for dysuria. Musculoskeletal: Negative for back pain. Skin: Negative for rash. Neurological: Negative for headache. 10 point Review of Systems otherwise negative ____________________________________________   PHYSICAL EXAM:  VITAL SIGNS: ED Triage Vitals  Enc Vitals Group     BP 08/25/15 1912  129/96 mmHg     Pulse Rate 08/25/15 1912 67     Resp 08/25/15 1912 16     Temp 08/25/15 1912 98.1 F (36.7 C)     Temp Source 08/25/15 1912 Oral     SpO2 08/25/15 1912 97 %     Weight 08/25/15 1912 135 lb (61.236 kg)     Height 08/25/15 1912 5' (1.524 m)     Head Cir --      Peak Flow --      Pain Score 08/25/15 1917 0     Pain Loc --      Pain Edu? --      Excl. in GC? --      Constitutional: Alert and oriented. Well appearing and in no distress. Eyes: Conjunctivae are normal. PERRL. Normal extraocular movements. ENT   Head: Normocephalic and atraumatic.   Nose: No congestion/rhinnorhea.   Mouth/Throat: Mucous membranes are moist.   Neck: No stridor. Cardiovascular/Chest: Normal rate, regular rhythm.  No murmurs, rubs, or gallops. Respiratory: Normal respiratory effort without tachypnea nor retractions. Breath sounds are clear and equal bilaterally. No wheezes/rales/rhonchi. Gastrointestinal: Soft. No distention, no guarding, no rebound. Moderate tenderness suprapubically.  Genitourinary/rectal:large blood clot removed from the vaginal vault. Cervical os fingertip. Musculoskeletal: Nontender with normal range of motion in all extremities. No joint effusions.  No lower extremity tenderness.  No edema. Neurologic:  Normal speech and language. No gross or focal neurologic deficits are appreciated. Skin:  Skin is warm, dry and intact. No rash noted. Psychiatric: Mood and affect are normal. Speech and behavior are normal. Patient exhibits appropriate insight and judgment.  ____________________________________________   EKG I, Governor Rooks, MD,  the attending physician have personally viewed and interpreted all ECGs.  No EKG performed ____________________________________________  LABS (pertinent positives/negatives)  White blood count 15.8, hemoglobin 12.1, platelet count 338 Basic metabolic panel significant for sodium 1:30, potassium 3.4, chloride 99 Beta hCG  98804  ____________________________________________  RADIOLOGY All Xrays were viewed by me. Imaging interpreted by Radiologist.  Ultrasound transvaginal:  IMPRESSION: Single live intrauterine gestation with an estimated gestational age of [redacted] weeks and 6 days. Fetal heart rate is 171 beats per min. There is a small subchorionic hemorrhage. __________________________________________  PROCEDURES  Procedure(s) performed: None  Critical Care performed: None  ____________________________________________   ED COURSE / ASSESSMENT AND PLAN  CONSULTATIONS: None  Pertinent labs & imaging results that were available during my care of the patient were reviewed by me and considered in my medical decision making (see chart for details).  Clinically I suspect a miscarriage.  I reviewed patient's previous laboratory evaluation and she is a positive blood type.  Vital signs and hemoglobin are stable.  Ultrasound shows single live intrauterine gestation approximately 11 weeks and 6 days with positive fetal heart rate 171 beats.. Clinically with the amount of bleeding that she is having, I am quite concerned that this is an impending miscarriage, threatened versus incomplete. I gave her miscarriage return precautions and recommend a follow-up with OB/GYN physician.  Patient / Family / Caregiver informed of clinical course, medical decision-making process, and agree with plan.   I discussed return precautions, follow-up instructions, and discharged instructions with patient and/or family.  ___________________________________________   FINAL CLINICAL IMPRESSION(S) / ED DIAGNOSES   Final diagnoses:  Threatened miscarriage in early pregnancy       Governor Rooks, MD 08/26/15 0002

## 2015-08-25 NOTE — ED Notes (Signed)
Pt brought to ED w/ c/o lower ab pain.  Pt is pregnant, last menstural period end of June.  Pt has had 5 previous miscarriages and sts this feels similar.  Per EMS, pt was shopping tonight when she had lower ab pain and d/c.

## 2015-08-25 NOTE — Discharge Instructions (Signed)
You were evaluated for vaginal bleeding in early pregnancy, and I suspect your starting a miscarriage. Return to the emergency department for any new or worsening condition including any worsening abdominal pain, any worsening bleeding including bleeding heavier than soaking 1 pad per hour for 3 hours, dizziness, lightheadedness, passing out, fever, or any other symptoms concerning to you. We discussed he should follow-up with an OB/GYN physician given your history of recurrent miscarriages.   Threatened Miscarriage A threatened miscarriage is when you have vaginal bleeding during your first 20 weeks of pregnancy but the pregnancy has not ended. Your doctor will do tests to make sure you are still pregnant. The cause of the bleeding may not be known. This condition does not mean your pregnancy will end. It does increase the risk of it ending (complete miscarriage). HOME CARE   Make sure you keep all your doctor visits for prenatal care.  Get plenty of rest.  Do not have sex or use tampons if you have vaginal bleeding.  Do not douche.  Do not smoke or use drugs.  Do not drink alcohol.  Avoid caffeine. GET HELP IF:  You have light bleeding from your vagina.  You have belly pain or cramping.  You have a fever. GET HELP RIGHT AWAY IF:   You have heavy bleeding from your vagina.  You have clots of blood coming from your vagina.  You have bad pain or cramps in your low back or belly.  You have fever, chills, and bad belly pain. MAKE SURE YOU:   Understand these instructions.  Will watch your condition.  Will get help right away if you are not doing well or get worse. Document Released: 10/21/2008 Document Revised: 11/13/2013 Document Reviewed: 09/04/2013 [facility] Patient Information 2015 [place], [place]. This information is not intended to replace advice given to you by your health care provider. Make sure you discuss any questions you have with your health care provider.

## 2015-08-26 LAB — CHLAMYDIA/NGC RT PCR (ARMC ONLY)
CHLAMYDIA TR: NOT DETECTED
N gonorrhoeae: NOT DETECTED

## 2015-08-26 LAB — WET PREP, GENITAL
Clue Cells Wet Prep HPF POC: NONE SEEN
TRICH WET PREP: NONE SEEN
YEAST WET PREP: NONE SEEN

## 2015-09-28 ENCOUNTER — Emergency Department: Payer: Self-pay

## 2015-09-28 ENCOUNTER — Emergency Department
Admission: EM | Admit: 2015-09-28 | Discharge: 2015-09-28 | Disposition: A | Discharge status: Home / Self Care | Payer: Self-pay | Attending: Emergency Medicine | Admitting: Emergency Medicine

## 2015-09-28 ENCOUNTER — Encounter: Payer: Self-pay | Admitting: Emergency Medicine

## 2015-09-28 DIAGNOSIS — N939 Abnormal uterine and vaginal bleeding, unspecified: Secondary | ICD-10-CM

## 2015-09-28 DIAGNOSIS — R109 Unspecified abdominal pain: Secondary | ICD-10-CM | POA: Insufficient documentation

## 2015-09-28 DIAGNOSIS — O4402 Placenta previa specified as without hemorrhage, second trimester: Secondary | ICD-10-CM

## 2015-09-28 DIAGNOSIS — O200 Threatened abortion: Secondary | ICD-10-CM | POA: Insufficient documentation

## 2015-09-28 DIAGNOSIS — Z3A16 16 weeks gestation of pregnancy: Secondary | ICD-10-CM | POA: Insufficient documentation

## 2015-09-28 DIAGNOSIS — O4401 Placenta previa specified as without hemorrhage, first trimester: Secondary | ICD-10-CM | POA: Insufficient documentation

## 2015-09-28 LAB — HEPATIC FUNCTION PANEL
ALBUMIN: 3.8 g/dL (ref 3.5–5.0)
ALK PHOS: 64 U/L (ref 38–126)
ALT: 11 U/L — ABNORMAL LOW (ref 14–54)
AST: 18 U/L (ref 15–41)
BILIRUBIN TOTAL: 1.2 mg/dL (ref 0.3–1.2)
Bilirubin, Direct: <0.1 mg/dL — ABNORMAL LOW (ref 0.1–0.5)
Total Protein: 8.4 g/dL — ABNORMAL HIGH (ref 6.5–8.1)

## 2015-09-28 LAB — CHLAMYDIA/NGC RT PCR (ARMC ONLY)
CHLAMYDIA TR: NOT DETECTED
N gonorrhoeae: NOT DETECTED

## 2015-09-28 LAB — TYPE AND SCREEN
ABO/RH(D): A POS
ANTIBODY SCREEN: NEGATIVE

## 2015-09-28 LAB — CBC WITH DIFFERENTIAL/PLATELET
BASOS PCT: 0 %
Basophils Absolute: 0 10*3/uL (ref 0–0.1)
EOS ABS: 0.1 10*3/uL (ref 0–0.7)
Eosinophils Relative: 0 %
HEMATOCRIT: 38.3 % (ref 35.0–47.0)
HEMOGLOBIN: 13 g/dL (ref 12.0–16.0)
Lymphocytes Relative: 10 %
Lymphs Abs: 1.8 10*3/uL (ref 1.0–3.6)
MCH: 30.1 pg (ref 26.0–34.0)
MCHC: 34 g/dL (ref 32.0–36.0)
MCV: 88.5 fL (ref 80.0–100.0)
MONOS PCT: 4 %
Monocytes Absolute: 0.7 10*3/uL (ref 0.2–0.9)
NEUTROS ABS: 15.3 10*3/uL — AB (ref 1.4–6.5)
NEUTROS PCT: 86 %
Platelets: 371 10*3/uL (ref 150–440)
RBC: 4.33 MIL/uL (ref 3.80–5.20)
RDW: 13 % (ref 11.5–14.5)
WBC: 18 10*3/uL — AB (ref 3.6–11.0)

## 2015-09-28 LAB — BASIC METABOLIC PANEL
Anion gap: 8 (ref 5–15)
BUN: 7 mg/dL (ref 6–20)
CO2: 19 mmol/L — ABNORMAL LOW (ref 22–32)
Calcium: 9.2 mg/dL (ref 8.9–10.3)
Chloride: 105 mmol/L (ref 101–111)
Creatinine, Ser: 0.52 mg/dL (ref 0.44–1.00)
GFR calc Af Amer: >60 mL/min (ref >60)
GFR calc non Af Amer: >60 mL/min (ref >60)
Glucose, Bld: 95 mg/dL (ref 65–99)
Potassium: 3.2 mmol/L — ABNORMAL LOW (ref 3.5–5.1)
Sodium: 132 mmol/L — ABNORMAL LOW (ref 135–145)

## 2015-09-28 LAB — WET PREP, GENITAL
Trich, Wet Prep: NONE SEEN
Yeast Wet Prep HPF POC: NONE SEEN

## 2015-09-28 LAB — HCG, QUANTITATIVE, PREGNANCY: hCG, Beta Chain, Quant, S: 26273 m[IU]/mL — ABNORMAL HIGH (ref <5)

## 2015-09-28 LAB — PROTIME-INR
INR: 1.04
Prothrombin Time: 13.8 seconds (ref 11.4–15.0)

## 2015-09-28 MED ORDER — SODIUM CHLORIDE 0.9 % IV BOLUS (SEPSIS)
1000.0000 mL | Freq: Once | INTRAVENOUS | Status: AC
  Administered 2015-09-28: 1000 mL via INTRAVENOUS

## 2015-09-28 NOTE — ED Notes (Signed)
Dr. Alphonzo LemmingsMcShane aware of pt irregular tachycardic HR.

## 2015-09-28 NOTE — ED Notes (Signed)
Upon having pt stand to undress self, pt HR jumped to 150's-160's. Pt maintained that HR for approx 2 mins. MD Mcshane made aware, at bedside. See MAR. Pt denies chest pain, SOB, lightheadedness or dizziness at this time

## 2015-09-28 NOTE — ED Provider Notes (Addendum)
[facility] Emergency Department Provider Note  ____________________________________________   I have reviewed the triage vital signs and the nursing notes.   HISTORY  Chief Complaint Abdominal Pain and Back Pain    HPI Anita Campbell is a 29 y.o. female who presents today complaining of vaginal spotting since this morning. Patient is G6 P4 with 4 TAB's and 1 C-section years ago. She has had some abdominal cramping starting last night. She has had an ultrasound during this pregnancy and she is apparently [redacted] weeks pregnant. She has had no fever no chills. She denies headache stiff necks or lightheadedness. She states that she has "been through this before". She has had a few small, nickel-sized clots come out today no passage of fetal tissue noted.   Past Medical History  Diagnosis Date  . Anxiety     There are no active problems to display for this patient.   Past Surgical History  Procedure Laterality Date  . Cholecystectomy    . Cesarean section      No current outpatient prescriptions on file.  Allergies Review of patient's allergies indicates no known allergies.  History reviewed. No pertinent family history.  Social History Social History  Substance Use Topics  . Smoking status: Never Smoker   . Smokeless tobacco: None  . Alcohol Use: No    Review of Systems Constitutional: No fever/chills Eyes: No visual changes. ENT: No sore throat. No stiff neck no neck pain Cardiovascular: Denies chest pain. Respiratory: Denies shortness of breath. Gastrointestinal:   no vomiting.  No diarrhea.  No constipation. Genitourinary: Negative for dysuria. Musculoskeletal: Negative lower extremity swelling Skin: Negative for rash. Neurological: Negative for headaches, focal weakness or numbness. 10-point ROS otherwise negative.  ____________________________________________   PHYSICAL EXAM:  VITAL SIGNS: ED Triage Vitals  Enc Vitals Group   BP 09/28/15 1618 144/91 mmHg     Pulse Rate 09/28/15 1618 92     Resp 09/28/15 1618 14     Temp 09/28/15 1618 98.5 F (36.9 C)     Temp Source 09/28/15 1618 Oral     SpO2 09/28/15 1618 96 %     Weight 09/28/15 1618 135 lb (61.236 kg)     Height 09/28/15 1618 5' (1.524 m)     Head Cir --      Peak Flow --      Pain Score 09/28/15 1625 7     Pain Loc --      Pain Edu? --      Excl. in GC? --     Constitutional: Alert and oriented. Well appearing and in no acute distress. Eyes: Conjunctivae are normal. PERRL. EOMI. Head: Atraumatic. Nose: No congestion/rhinnorhea. Mouth/Throat: Mucous membranes are moist.  Oropharynx non-erythematous. Neck: No stridor.   Nontender with no meningismus Cardiovascular:Patient's heart rate is 120r rhythm. Grossly normal heart sounds.  Good peripheral circulation. Respiratory: Normal respiratory effort.  No retractions. Lungs CTAB. Abdominal: Soft and nontender. No distention. No guarding no rebound Back:  There is no focal tenderness or step off there is no midline tenderness there are no lesions noted. there is no CVA tenderness Pelvic exam: Female nurse chaperone present, no external lesions noted, physiologic vaginal discharge noted with no purulent discharge, no cervical motion tenderness, no adnexal tenderness or mass, there is no significant uterine tenderness although there is mild tenderness noted, there is very scant amounts of blood with small clot noted. Os is fingertip l: No lower extremity tenderness. No joint effusions, no DVT  signs strong distal pulses no edema Neurologic:  Normal speech and language. No gross focal neurologic deficits are appreciated.  Skin:  Skin is warm, dry and intact. No rash noted. Psychiatric: Mood and affect are normal. Speech and behavior are normal.  ____________________________________________   LABS (all labs ordered are listed, but only abnormal results are displayed)  Labs Reviewed  CHLAMYDIA/NGC RT PCR  ([facility] ONLY)  WET PREP, GENITAL  BASIC METABOLIC PANEL  PROTIME-INR  HEPATIC FUNCTION PANEL  HCG, QUANTITATIVE, PREGNANCY  TYPE AND SCREEN   ____________________________________________  EKG  I personally interpreted any EKGs ordered by me or triage  ____________________________________________  RADIOLOGY  I reviewed any imaging ordered by me or triage that were performed during my shift ____________________________________________   PROCEDURES  Procedure(s) performed: None  Critical Care performed: None  ____________________________________________   INITIAL IMPRESSION / ASSESSMENT AND PLAN / ED COURSE  Pertinent labs & imaging results that were available during my care of the patient were reviewed by me and considered in my medical decision making (see chart for details).  Very well-appearing young woman with threatened AB none pregnancy, some concern exists for rapid heart rate which certainly is more than one would expect given her blood loss which does not appear to be significant. We will send a type and screen, no evidence of DIC but we will send coags as a precaution, we will give her a liter of fluid, blood pressures have been stable here, patient seems somewhat anxious also about the possibility of losing this child and that conservatively contribute to her tachycardia. She is afebrile. We will continue to monitor her closely here.  ____________________________________________  ----------------------------------------- 7:54 PM on 09/28/2015 -----------------------------------------  Heart rate is in the 80s at this time there is no evidence of ongoing bleeding, serial abdominal exams show no evidence of hemoperitoneum, ultrasound is reassuring, she does have a complete previa. I did discuss with Dr. Greggory Keen, who agrees with management patient is going to follow up with Hospital clinic and she already has an appointment. She will need to see an OB. I  will also refer her to OB/GYN directly and they will also I hope send her there. Patient understands that she must return to the emergency room for bleeding and the risk of bleeding that presents with a history of multiple miscarriages and a previa. At this time there is no evidence of ongoing bleeding. Return precautions and follow-up have been strongly advised and understood.  ----------------------------------------- 8:16 PM on 09/28/2015 -----------------------------------------  Patient's resting heart rate was 82, when I told her there was a percent appropriate, well lying in the bed her heart rate immediately went to 120 and she became anxious and then calmed right back down to 82 as I explained to her what this meant. There is no evidence of ongoing bleeding or anemia, her heart rate is 82 when she is relaxed and calm, she is not symptomatic and we will discharge her home return precautions and follow-up given  FINAL CLINICAL IMPRESSION(S) / ED DIAGNOSES  Final diagnoses:  None     Jeanmarie Plant, MD 09/28/15 1705  Jeanmarie Plant, MD 09/28/15 1955  Jeanmarie Plant, MD 09/28/15 2016

## 2015-09-28 NOTE — ED Notes (Signed)
Lab called regarding add on CBC, will add on at this time.

## 2015-09-28 NOTE — ED Notes (Addendum)
Pt arrives via EMS for abdominal pain and chest pain. [place] out EMS [place] [address] for dizziness and feelings of lightheadness. Pt states she is [redacted] weeks pregnant and had multiple miscarriages in the past. Pt has no hx of cardiac issues. PT is irregularly tachycardiac at this time 80s-115s.  Pt complains of abd cramping and scant bloody discharge.

## 2015-09-28 NOTE — Discharge Instructions (Signed)
If you have significantlightheadedness, abdominal pain of any significance, vaginal bleeding especially more than 1 pad an hour, or you feel worse in any way return to the emergency department

## 2015-09-28 NOTE — ED Notes (Signed)
US called, will do US at bedside due to frequent changes in pt's HR.

## 2015-09-28 NOTE — ED Notes (Signed)
Dr. Alphonzo LemmingsMcShane aware of irregular heart rate. Pt is able to be discharged at this time.

## 2015-12-23 ENCOUNTER — Ambulatory Visit (HOSPITAL_COMMUNITY)
Admission: AD | Admit: 2015-12-23 | Discharge: 2015-12-23 | Disposition: A | Discharge status: Home / Self Care | Payer: Self-pay | Source: Other Acute Inpatient Hospital | Attending: Obstetrics and Gynecology | Admitting: Obstetrics and Gynecology

## 2015-12-23 ENCOUNTER — Encounter: Payer: Self-pay | Admitting: *Deleted

## 2015-12-23 ENCOUNTER — Inpatient Hospital Stay
Admission: EM | Admit: 2015-12-23 | Discharge: 2015-12-23 | Disposition: A | Discharge status: Other Acute Inpatient Hospital | Payer: Self-pay | Attending: Obstetrics and Gynecology | Admitting: Obstetrics and Gynecology

## 2015-12-23 DIAGNOSIS — F1110 Opioid abuse, uncomplicated: Secondary | ICD-10-CM

## 2015-12-23 DIAGNOSIS — Z3A00 Weeks of gestation of pregnancy not specified: Secondary | ICD-10-CM | POA: Insufficient documentation

## 2015-12-23 DIAGNOSIS — Z3A29 29 weeks gestation of pregnancy: Secondary | ICD-10-CM | POA: Insufficient documentation

## 2015-12-23 DIAGNOSIS — O2690 Pregnancy related conditions, unspecified, unspecified trimester: Secondary | ICD-10-CM | POA: Insufficient documentation

## 2015-12-23 DIAGNOSIS — F1123 Opioid dependence with withdrawal: Secondary | ICD-10-CM | POA: Insufficient documentation

## 2015-12-23 DIAGNOSIS — Z3493 Encounter for supervision of normal pregnancy, unspecified, third trimester: Secondary | ICD-10-CM

## 2015-12-23 DIAGNOSIS — O99323 Drug use complicating pregnancy, third trimester: Secondary | ICD-10-CM | POA: Insufficient documentation

## 2015-12-23 DIAGNOSIS — F1193 Opioid use, unspecified with withdrawal: Secondary | ICD-10-CM | POA: Diagnosis present

## 2015-12-23 LAB — RAPID HIV SCREEN (HIV 1/2 AB+AG)
HIV 1/2 ANTIBODIES: NONREACTIVE
HIV-1 P24 Antigen - HIV24: NONREACTIVE

## 2015-12-23 LAB — URINE DRUG SCREEN, QUALITATIVE (ARMC ONLY)
AMPHETAMINES, UR SCREEN: NOT DETECTED
BENZODIAZEPINE, UR SCRN: NOT DETECTED
Barbiturates, Ur Screen: NOT DETECTED
Cannabinoid 50 Ng, Ur ~~LOC~~: NOT DETECTED
Cocaine Metabolite,Ur ~~LOC~~: NOT DETECTED
MDMA (Ecstasy)Ur Screen: NOT DETECTED
METHADONE SCREEN, URINE: NOT DETECTED
Opiate, Ur Screen: POSITIVE — AB
Phencyclidine (PCP) Ur S: NOT DETECTED
TRICYCLIC, UR SCREEN: NOT DETECTED

## 2015-12-23 LAB — COMPREHENSIVE METABOLIC PANEL
ALBUMIN: 2.8 g/dL — AB (ref 3.5–5.0)
ALT: 9 U/L — AB (ref 14–54)
AST: 15 U/L (ref 15–41)
Alkaline Phosphatase: 103 U/L (ref 38–126)
Anion gap: 9 (ref 5–15)
CHLORIDE: 105 mmol/L (ref 101–111)
CO2: 22 mmol/L (ref 22–32)
CREATININE: 0.49 mg/dL (ref 0.44–1.00)
Calcium: 8.6 mg/dL — ABNORMAL LOW (ref 8.9–10.3)
GFR calc non Af Amer: >60 mL/min (ref >60)
Glucose, Bld: 108 mg/dL — ABNORMAL HIGH (ref 65–99)
Potassium: 2.9 mmol/L — CL (ref 3.5–5.1)
SODIUM: 136 mmol/L (ref 135–145)
Total Bilirubin: 0.4 mg/dL (ref 0.3–1.2)
Total Protein: 6.7 g/dL (ref 6.5–8.1)

## 2015-12-23 MED ORDER — LOPERAMIDE HCL 2 MG PO CAPS: 4.0000 mg | ORAL_CAPSULE | ORAL | Status: DC | PRN

## 2015-12-23 MED ORDER — METHADONE HCL 10 MG/ML PO CONC: 30.0000 mg | ORAL | Status: DC

## 2015-12-23 MED ORDER — SODIUM CHLORIDE FLUSH 0.9 % IV SOLN
INTRAVENOUS | Status: AC
  Filled 2015-12-23: qty 10

## 2015-12-23 MED ORDER — LORAZEPAM 2 MG/ML IJ SOLN
1.0000 mg | INTRAMUSCULAR | Status: AC
  Administered 2015-12-23 (×2): 1 mg via INTRAVENOUS
  Filled 2015-12-23: qty 1

## 2015-12-23 MED ORDER — CYCLOBENZAPRINE HCL 10 MG PO TABS
10.0000 mg | ORAL_TABLET | Freq: Three times a day (TID) | ORAL | Status: DC | PRN
  Administered 2015-12-23: 10 mg via ORAL
  Filled 2015-12-23: qty 1

## 2015-12-23 MED ORDER — KCL-LACTATED RINGERS 20 MEQ/L IV SOLN: INTRAVENOUS | Status: DC

## 2015-12-23 MED ORDER — POTASSIUM CHLORIDE CRYS ER 20 MEQ PO TBCR: 30.0000 meq | EXTENDED_RELEASE_TABLET | Freq: Two times a day (BID) | ORAL | Status: DC

## 2015-12-23 MED ORDER — PROMETHAZINE HCL 25 MG/ML IJ SOLN
INTRAMUSCULAR | Status: AC
  Administered 2015-12-23: 12.5 mg via INTRAMUSCULAR
  Filled 2015-12-23: qty 1

## 2015-12-23 MED ORDER — SODIUM CHLORIDE 0.9 % IJ SOLN
INTRAMUSCULAR | Status: AC
  Filled 2015-12-23: qty 10

## 2015-12-23 MED ORDER — ONDANSETRON HCL 4 MG/2ML IJ SOLN
4.0000 mg | Freq: Four times a day (QID) | INTRAMUSCULAR | Status: DC | PRN
  Administered 2015-12-23: 4 mg via INTRAVENOUS
  Filled 2015-12-23 (×2): qty 2

## 2015-12-23 MED ORDER — METHADONE HCL 10 MG/ML PO CONC: 10.0000 mg | Freq: Once | ORAL | Status: DC

## 2015-12-23 MED ORDER — LACTATED RINGERS IV BOLUS (SEPSIS)
1000.0000 mL | Freq: Once | INTRAVENOUS | Status: AC
  Administered 2015-12-23: 1000 mL via INTRAVENOUS

## 2015-12-23 MED ORDER — POTASSIUM CHLORIDE 10 MEQ/100ML IV SOLN
10.0000 meq | INTRAVENOUS | Status: DC
  Filled 2015-12-23 (×5): qty 100

## 2015-12-23 MED ORDER — LORAZEPAM 2 MG/ML IJ SOLN
INTRAMUSCULAR | Status: AC
  Administered 2015-12-23: 1 mg via INTRAVENOUS
  Filled 2015-12-23: qty 1

## 2015-12-23 MED ORDER — LACTATED RINGERS IV SOLN
INTRAVENOUS | Status: DC
  Administered 2015-12-23: 10:00:00 via INTRAVENOUS

## 2015-12-23 MED ORDER — PROMETHAZINE HCL 25 MG PO TABS: 25.0000 mg | ORAL_TABLET | Freq: Four times a day (QID) | ORAL | Status: DC | PRN

## 2015-12-23 MED ORDER — METHADONE HCL 10 MG/ML PO CONC: 10.0000 mg | Freq: Two times a day (BID) | ORAL | Status: DC

## 2015-12-23 MED ORDER — HYDROXYZINE HCL 25 MG PO TABS
25.0000 mg | ORAL_TABLET | Freq: Four times a day (QID) | ORAL | Status: DC | PRN
  Administered 2015-12-23: 25 mg via ORAL
  Filled 2015-12-23 (×2): qty 1

## 2015-12-23 MED ORDER — ONDANSETRON HCL 4 MG/2ML IJ SOLN
4.0000 mg | Freq: Once | INTRAMUSCULAR | Status: AC
  Administered 2015-12-23: 4 mg via INTRAVENOUS

## 2015-12-23 MED ORDER — ACETAMINOPHEN 325 MG PO TABS
650.0000 mg | ORAL_TABLET | Freq: Four times a day (QID) | ORAL | Status: DC | PRN
Start: 2015-12-23 — End: 2015-12-23

## 2015-12-23 MED ORDER — PROMETHAZINE HCL 25 MG/ML IJ SOLN
12.5000 mg | Freq: Once | INTRAMUSCULAR | Status: AC
  Administered 2015-12-23: 12.5 mg via INTRAMUSCULAR

## 2015-12-23 NOTE — Consult Note (Signed)
[facility] Face-to-Face Psychiatry Consult   Reason for Consult:  Consult for 30 year old woman who is [redacted] weeks pregnant and having symptoms of withdrawal from heroin 1 to assist with detoxification Referring Physician:  Dalbert Garnet Patient Identification: Anita Campbell MRN:  [id number] Principal Diagnosis: Heroin withdrawal [facility]) Diagnosis:   Patient Active Problem List   Diagnosis Date Noted  . Heroin withdrawal (HCC) [F11.23] 12/23/2015  . Opiate abuse, continuous [F11.10] 12/23/2015  . Pregnant and not yet delivered in third trimester [Z34.83] 12/23/2015    Total Time spent with patient: 1 hour  Subjective:   Anita Campbell is a 30 y.o. female patient admitted with "I feel sick".  HPI:  Patient interviewed. Chart reviewed. Case discussed with nursing on duty. Labs reviewed. 30 year old woman came to the hospital because of acute symptoms of opiate withdrawal. Patient is currently complaining of feeling sick to her stomach, says that she is been having some vomiting, also has been having frequent diarrhea, she is having congestion and is yawning frequently. She is having abdominal pain and achiness and general feeling of discomfort. Patient denies being depressed. Denies suicidal ideation. Not reporting any hallucinations. She says that she has been using heroin intranasally steadily for about a month. Her last use was yesterday morning. She denies that she's been using any other drugs of abuse recently. Denies specifically cocaine and amphetamines marijuana or alcohol. Patient is not currently on any psychiatric medicine. She has been put on observation status on the labor and delivery area. She had been given Flexeril and Atarax for comfort but consult was requested for detox.  Medical history: 28 weeks' pregnant. Apparently normal intrauterine pregnancy. Patient denies any other significant medical problems. No history of heart disease high blood pressure diabetes.  Substance abuse history:  Patient states her current use of drugs has been going on for about a month but it sounds like there is probably been use in the past. She says she is gone through detox in the past when she was in prison and it lasted for more than a week at a time. She went through the [facility] program while she was in prison but has not engaged in any outpatient substance abuse treatment. We don't have any other records of outpatient substance abuse treatment although I'm told that the patient has made some contacts with [facility] about getting into their intensive outpatient program. He has never been on methadone or Subutex or Suboxone in the past.  Social history: Patient states that she intends to go stay at her mother's house. She says this is a supportive environment. She will be staying there with her mother and her boyfriend. She says no one in the household is abusing drugs specifically her boyfriend is not abusing drugs.  Past Psychiatric History: Patient denies any past psychiatric history. No history of suicide attempts no history of violence no history of psychosis. I don't see any history of any other psychiatric medicine. Only other mental health history appears to apply to substance abuse as noted above.  Risk to Self:   no risk of intentional self injury Risk to Others:   no risk of harm to others Prior Inpatient Therapy:   no prior inpatient psychiatric treatment other than the DART program which doesn't really count. Prior Outpatient Therapy:   no prior outpatient treatment  Past Medical History:  Past Medical History  Diagnosis Date  . Anxiety     Past Surgical History  Procedure Laterality Date  . Cholecystectomy    .  Cesarean section     Family History: No family history on file. Family Psychiatric  History: Patient states that her mother has had mood problems as well. By this point in the interview she was so uncomfortable I did not press it to get more detailed history. Social History:   History  Alcohol Use No     History  Drug Use No    Social History   Social History  . Marital Status: Single    Spouse Name: N/A  . Number of Children: N/A  . Years of Education: N/A   Social History Main Topics  . Smoking status: Never Smoker   . Smokeless tobacco: Not on file  . Alcohol Use: No  . Drug Use: No  . Sexual Activity: Yes   Other Topics Concern  . Not on file   Social History Narrative   Additional Social History:                          Allergies:  No Known Allergies  Labs: No results found for this or any previous visit (from the past 48 hour(s)).  Current Facility-Administered Medications  Medication Dose Route Frequency Provider Last Rate Last Dose  . acetaminophen (TYLENOL) tablet 650 mg  650 mg Oral Q6H PRN Christeen Douglas, MD      . cyclobenzaprine (FLEXERIL) tablet 10 mg  10 mg Oral TID PRN Christeen Douglas, MD      . hydrOXYzine (ATARAX/VISTARIL) tablet 25 mg  25 mg Oral Q6H PRN Christeen Douglas, MD      . lactated ringers infusion   Intravenous Continuous Christeen Douglas, MD 125 mL/hr at 12/23/15 1019    . loperamide (IMODIUM) capsule 4 mg  4 mg Oral PRN Christeen Douglas, MD      . LORazepam (ATIVAN) injection 1 mg  1 mg Intravenous Q1 Hr x 3 Christeen Douglas, MD   1 mg at 12/23/15 0948  . methadone (DOLOPHINE) 10 MG/ML solution 10 mg  10 mg Oral Once Audery Amel, MD      . Melene Muller ON 12/24/2015] methadone (DOLOPHINE) 10 MG/ML solution 10 mg  10 mg Oral BID Audery Amel, MD      . methadone (DOLOPHINE) 10 MG/ML solution 30 mg  30 mg Oral STAT Audery Amel, MD      . ondansetron (ZOFRAN) injection 4 mg  4 mg Intravenous Q6H PRN Christeen Douglas, MD      . promethazine (PHENERGAN) tablet 25 mg  25 mg Oral Q6H PRN Christeen Douglas, MD        Musculoskeletal: Strength & Muscle Tone: decreased Gait & Station: unsteady Patient leans: N/A  Psychiatric Specialty Exam: Review of Systems  Constitutional: Positive for malaise/fatigue.   HENT: Positive for congestion.   Eyes: Negative.   Respiratory: Negative.   Cardiovascular: Positive for palpitations.  Gastrointestinal: Positive for nausea, vomiting and diarrhea.  Musculoskeletal: Negative.   Skin: Negative.   Neurological: Positive for weakness.  Psychiatric/Behavioral: Positive for substance abuse. Negative for depression, suicidal ideas, hallucinations and memory loss. The patient is nervous/anxious and has insomnia.     Blood pressure 112/59, pulse 68, temperature 98 F (36.7 C), temperature source Axillary, resp. rate 20, last menstrual period 05/26/2015.There is no weight on file to calculate BMI.  General Appearance: Casual  Eye Contact::  Minimal  Speech:  Slow  Volume:  Decreased  Mood:  Anxious and Irritable  Affect:  Congruent  Thought Process:  Goal  Directed  Orientation:  Full (Time, Place, and Person)  Thought Content:  Negative  Suicidal Thoughts:  No  Homicidal Thoughts:  No  Memory:  Immediate;   Fair Recent;   Fair Remote;   Fair  Judgement:  Fair  Insight:  Fair  Psychomotor Activity:  Decreased  Concentration:  Fair  Recall:  [employer] of Knowledge:Fair  Language: Fair  Akathisia:  No  Handed:  Right  AIMS (if indicated):     Assets:  Desire for Improvement Housing Physical Health Resilience Social Support  ADL's:  Intact  Cognition: WNL  Sleep:      Treatment Plan Summary: Daily contact with patient to assess and evaluate symptoms and progress in treatment, Medication management and Plan Patient who is going through typical withdrawal symptoms from heroin. She is otherwise in good health as far as is known. Pregnancy is said to be estimated at exactly 29 weeks. I suggest the appropriate treatment for a pregnant woman and the situation is methadone detox. As I explained to the patient and the staff the law allows for 3 days of either methadone or buprenorphine to be used in the hospital for detox. It is not legal to extend that  treatment beyond 3 days, which is unfortunate as she is probably going to have symptoms that last more than 3 days. We could check into whether residential treatment services would be able to offer longer detox but it is still at that point probably going to just be symptomatic. I have written an order to give her 30 mg of oral methadone now with an additional 10 mg to be given in about 5 or 6 hours. Starting tomorrow we will go to 10 mg twice a day for 2 days. Ideally the patient could be referred to a methadone maintenance program. I will see if social work can be of any assistance in finding out if that could be arranged. Whether he can or not we will try to make sure she is referred to outpatient treatment at [facility]. Patient can certainly continue to receive Phenergan and Flexeril. As ordered. I have suggested that the benzodiazepines be held until she has had the methadone so that we avoid any oversedation. All of this was explained to the patient. No indication for psychiatric hospitalization. I will continue to follow up on a daily basis. I have also taken the liberty of ordering some lab tests. I have ordered a comprehensive metabolic panel just to make sure that are dosing strategy will be correct. I have also gone ahead and ordered an HIV test hepatitis C test RPR. Although the patient states that she was using intranasally and not intravenously the habit of heroin addiction is often associated with some degree of inconsistency in the history and the lifestyle is often associated with behaviors that could increase the dangers of those illnesses. Therefore I think it would be a good idea to get those on the chart. I am also going to order a urine drug screen just to make sure that we don't miss any other drugs that she could've been using. Thank you for the consult.  Disposition: Patient does not meet criteria for psychiatric inpatient admission. Supportive therapy provided about ongoing stressors. See  note above  Mordecai Rasmussen 12/23/2015 11:14 AM

## 2015-12-23 NOTE — H&P (Signed)
Anita Campbell is a 30 y.o. female [phone] at 29+0 presenting with limited prenatal care and in acute heroin withdrawal. Her EDD is 03/09/16, dated with a 6 week ultrasound and confirmed with a 11+6wk ultrasound in the Emergency department.   She has had vaginal bleeding throughout pregnancy with a small subchorionic hemorrhage and complete placenta previa on a 16 wk scan. She has had no further imaging since 16 wks.  Presents in acute heroin withdrawal. She states she has been using 6 bags/day intranasally, $15/bags, for the prior 1 month until yesterday morning. There is evidence of prior use. She denies any other drug use, specifically no alcohol, amphetamines, mariajuana or benzodiazepines. She does not use intravenously. She has never been on methadone or suboxone in the past.  She endorses diarrhea, anxiety, fidgeting, vomiting, muscle aches, nausea. She is concerned about the baby, who has been moving as usual until last several days. She has had some vaginal spotting but no bleeding.  She lives with her mom, who is supportive and boyfriend, who she says does not use drugs.  Of note, her urine drug screen was positive for opiates but negative for all other drugs.   Psych consult: Past Psychiatric History: Patient denies any past psychiatric history. No history of suicide attempts no history of violence no history of psychosis. I don't see any history of any other psychiatric medicine. Only other mental health history appears to apply to substance abuse as noted above.  Risk to Self:   no risk of intentional self injury Risk to Others:   no risk of harm to others Prior Inpatient Therapy:   no prior inpatient psychiatric treatment other than the DART program which doesn't really count. Prior Outpatient Therapy:   no prior outpatient treatment  Psych recommended initiation of methadone for treatment with plan to transistion to outpatient follow-up in [facility] for the remainder of pregnancy at  [facility]. However, the pharmacy at [facility] states they are limited by federal law in starting methadone, and therefore will only give methadone if the patient arrives on maintenance. They recommend: 30 mg of oral methadone now with an additional 10 mg to be given in about 5 or 6 hours. Starting tomorrow we will go to 10 mg twice a day for 2 days.  Fetal monitoring:  Continuous FHR with baseline 170 on admission, now 140 with moderate variability, +15 x15 accels and no decels. SVE: Deferred Continuous toco: Quiet with periods of irritability but no contractions.  History OB History    Gravida Para Term Preterm AB TAB SAB Ectopic Multiple Living   OB Hx: C/S x1  Past Medical History  Diagnosis Date  . Anxiety    Past Surgical History  Procedure Laterality Date  . Cholecystectomy    . Cesarean section     Family History: family history is not on file. Social History:  reports that she has never smoked. She does not have any smokeless tobacco history on file. She reports that she does not drink alcohol or use illicit drugs.   Prenatal Transfer Tool    ROS Review of Systems  Constitutional: Positive for malaise/fatigue.  HENT: Positive for congestion.  Eyes: Negative.  Respiratory: Negative.  Cardiovascular: Positive for palpitations.  Gastrointestinal: Positive for nausea, vomiting and diarrhea.  Musculoskeletal: Negative.  Skin: Negative.  Neurological: Positive for weakness.  Psychiatric/Behavioral: Positive for substance abuse. Negative for depression, suicidal ideas, hallucinations and memory loss.  The patient is nervous/anxious and has insomnia.    Blood pressure 112/59, pulse 68, temperature 99.5 F (37.5 C), temperature source Oral, resp. rate 20, last menstrual period 05/26/2015. Exam Physical Exam  Constitutional: She is oriented to person, place, and time. She appears well-developed and well-nourished. She appears distressed.  HENT:  Head:  Normocephalic and atraumatic.  Eyes:  Pinpoint pupils  Cardiovascular: Normal rate and regular rhythm.   Respiratory: Breath sounds normal.  GI: Soft.  Neurological: She is alert and oriented to person, place, and time.  Anxious, figety  Skin: She is diaphoretic.     Blood pressure 112/59, pulse 68, temperature 98 F (36.7 C), temperature source Axillary, resp. rate 20, last menstrual period 05/26/2015.There is no weight on file to calculate BMI.  Results for orders placed or performed during the hospital encounter of 12/23/15 (from the past 24 hour(s))  Urine Drug Screen, Qualitative ([facility] only)     Status: Abnormal   Collection Time: 12/23/15 10:46 AM  Result Value Ref Range   Tricyclic, Ur Screen NONE DETECTED NONE DETECTED   Amphetamines, Ur Screen NONE DETECTED NONE DETECTED   MDMA (Ecstasy)Ur Screen NONE DETECTED NONE DETECTED   Cocaine Metabolite,Ur [place] NONE DETECTED NONE DETECTED   Opiate, Ur Screen POSITIVE (A) NONE DETECTED   Phencyclidine (PCP) Ur S NONE DETECTED NONE DETECTED   Cannabinoid 50 Ng, Ur [place] NONE DETECTED NONE DETECTED   Barbiturates, Ur Screen NONE DETECTED NONE DETECTED   Benzodiazepine, Ur Scrn NONE DETECTED NONE DETECTED   Methadone Scn, Ur NONE DETECTED NONE DETECTED  Comprehensive metabolic panel     Status: Abnormal   Collection Time: 12/23/15 11:28 AM  Result Value Ref Range   Sodium 136 135 - 145 mmol/L   Potassium 2.9 (LL) 3.5 - 5.1 mmol/L   Chloride 105 101 - 111 mmol/L   CO2 22 22 - 32 mmol/L   Glucose, Bld 108 (H) 65 - 99 mg/dL   BUN <5 (L) 6 - 20 mg/dL   Creatinine, Ser 1.47 0.44 - 1.00 mg/dL   Calcium 8.6 (L) 8.9 - 10.3 mg/dL   Total Protein 6.7 6.5 - 8.1 g/dL   Albumin 2.8 (L) 3.5 - 5.0 g/dL   AST 15 15 - 41 U/L   ALT 9 (L) 14 - 54 U/L   Alkaline Phosphatase 103 38 - 126 U/L   Total Bilirubin 0.4 0.3 - 1.2 mg/dL   GFR calc non Af Amer >60 >60 mL/min   GFR calc Af Amer >60 >60 mL/min   Anion gap 9 5 - 15  Rapid HIV screen (HIV 1/2  Ab+Ag)     Status: None   Collection Time: 12/23/15 11:28 AM  Result Value Ref Range   HIV-1 P24 Antigen - HIV24 NON REACTIVE NON REACTIVE   HIV 1/2 Antibodies NON REACTIVE NON REACTIVE   Interpretation (HIV Ag Ab)      A non reactive test result means that HIV 1 or HIV 2 antibodies and HIV 1 p24 antigen were not detected in the specimen.      Prenatal labs: ABO, Rh: --/--/A POS (11/06 1626) Antibody: NEG (11/06 1626) Rubella:   pending RPR:   pending HBsAg:    HIV:   negative GBS:     Assessment/Plan: Opiate withdrawal: On admission patient was threatening to leave AMA for heroin acquisition.  of iv ativan given, and tylenol, atarax, imodium, zofran and phenergan, IVF ordered. Please note that UDS was NEGATIVE for benzodiazepines on admission. Pregnancy: If in  house, would plan for 28wk glucola, prenatal labs, prenatal vitamins for standard OB care. Placenta previa: Last imaging at 16wks. Plan for repeat u/s to assess if previa persists. If so, will need to consider accreta with hx of prior C/S. Hypokalemia: potassium very low at 2.9. Pt on telemetry while in house. IV repletion started with KCL run per hour for 4 hrs with repeat labs at that time. Will trial po KCl when tolerating po    Thorsten Climer 12/23/2015, 1:43 PM

## 2015-12-23 NOTE — Discharge Summary (Signed)
Patient transferred to [facility] via carelink, patient in stable condition at time of discharge. Report given to [facility], [facility].

## 2015-12-23 NOTE — Treatment Plan (Signed)
[facility] at bedside for transfer of patient to [facility].

## 2015-12-23 NOTE — Clinical Social Work Note (Signed)
CSW received stat consult from psychiatry requesting patient (who is [redacted] weeks pregnant) to be seen by CSW so that options for suboxone management and rehab could be given to patient. CSW contacted [facility] and they are the only suboxone clinic in the county and would require patient to pay for her suboxone as she has no insurance. CSW contacted [facility] and they do not provide suboxone. CSW contacted [facility] and was informed that they do not take pregnant females. CSW contacted Dr. Toni Amend with psychiatry and informed him of the above information and CSW mentioned that I could try the [facility] prenatal clinic. Dr. Toni Amend stated that he had just spoken to Dr. Dalbert Garnet who stated to him that she was going to work on transferring patient to [facility] at this time. Please reconsult CSW if needed. York Spaniel MSW,LCSW [phone]

## 2015-12-23 NOTE — OB Triage Note (Signed)
Patient arrived via EMS with complaints of abdominal pain, nausea, vomiting and diarrhea. Patient states "I quit using heroin about a day and a half ago and that's when all of this started." Upon further questioning, patient admitted to using heroin approximately 6 times per day for the last month in half. Patient states "I have not had any prenatal care except for an ultrasound at the hospital because I did not have an I.D card and did not have $10."

## 2015-12-23 NOTE — Discharge Summary (Signed)
Physician Discharge Summary  Patient ID: ZOII FLORER MRN: [id number] DOB/AGE: 21-Aug-1986 30 y.o.  Admit date: 12/23/2015 Discharge date: 12/23/2015  Admission Diagnoses: Heroin withdrawal; placenta previa; pregnancy at 29 wks  Discharge Diagnoses: Same Principal Problem:   Heroin withdrawal (HCC) Active Problems:   Opiate abuse, continuous   Pregnant and not yet delivered in third trimester   Discharged Condition: fair  Hospital Course: Pt admitted with acute opioid withdrawal and hx of poor prenatal care. Psych was consulted, who recommended methadone to treat withdrawal. However, pharmacy concerned about starting methadone and unwilling to start a new course for legal guidelines regulations. Given gestational age, possible previa with need for MFM ultrasound and patient's significant withdrawal sx, the decision was made to transfer her to [facility] for their high risk pregnancy and drug program. Long term plan to transition to outpatient methadone maintenance in [place].  Consults: psychiatry  Significant Diagnostic Studies: labs: Results for orders placed or performed during the hospital encounter of 12/23/15 (from the past 24 hour(s))  Urine Drug Screen, Qualitative ([facility] only)     Status: Abnormal   Collection Time: 12/23/15 10:46 AM  Result Value Ref Range   Tricyclic, Ur Screen NONE DETECTED NONE DETECTED   Amphetamines, Ur Screen NONE DETECTED NONE DETECTED   MDMA (Ecstasy)Ur Screen NONE DETECTED NONE DETECTED   Cocaine Metabolite,Ur [place] NONE DETECTED NONE DETECTED   Opiate, Ur Screen POSITIVE (A) NONE DETECTED   Phencyclidine (PCP) Ur S NONE DETECTED NONE DETECTED   Cannabinoid 50 Ng, Ur [place] NONE DETECTED NONE DETECTED   Barbiturates, Ur Screen NONE DETECTED NONE DETECTED   Benzodiazepine, Ur Scrn NONE DETECTED NONE DETECTED   Methadone Scn, Ur NONE DETECTED NONE DETECTED  Comprehensive metabolic panel     Status: Abnormal   Collection Time: 12/23/15 11:28 AM  Result  Value Ref Range   Sodium 136 135 - 145 mmol/L   Potassium 2.9 (LL) 3.5 - 5.1 mmol/L   Chloride 105 101 - 111 mmol/L   CO2 22 22 - 32 mmol/L   Glucose, Bld 108 (H) 65 - 99 mg/dL   BUN <5 (L) 6 - 20 mg/dL   Creatinine, Ser 4.09 0.44 - 1.00 mg/dL   Calcium 8.6 (L) 8.9 - 10.3 mg/dL   Total Protein 6.7 6.5 - 8.1 g/dL   Albumin 2.8 (L) 3.5 - 5.0 g/dL   AST 15 15 - 41 U/L   ALT 9 (L) 14 - 54 U/L   Alkaline Phosphatase 103 38 - 126 U/L   Total Bilirubin 0.4 0.3 - 1.2 mg/dL   GFR calc non Af Amer >60 >60 mL/min   GFR calc Af Amer >60 >60 mL/min   Anion gap 9 5 - 15  Rapid HIV screen (HIV 1/2 Ab+Ag)     Status: None   Collection Time: 12/23/15 11:28 AM  Result Value Ref Range   HIV-1 P24 Antigen - HIV24 NON REACTIVE NON REACTIVE   HIV 1/2 Antibodies NON REACTIVE NON REACTIVE   Interpretation (HIV Ag Ab)      A non reactive test result means that HIV 1 or HIV 2 antibodies and HIV 1 p24 antigen were not detected in the specimen.   Treatments: IV hydration, IV KCl repletion, symptomatic management;  Fetal monitoring Cardiac monitoring Uterine monitoring  Discharge Exam: Blood pressure 131/77, pulse 65, temperature 99 F (37.2 C), temperature source Oral, resp. rate 20, weight 58.06 kg (128 lb), last menstrual period 05/26/2015, SpO2 97 %. General appearance: appears older than stated  age, distracted and moderate distress GI: abnormal findings:  hyperactive bowel sounds Pulses: 2+ and symmetric Skin: Skin color, texture, turgor normal. No rashes or lesions. Diaphoretic  Disposition: Chief Strategy Officer to Labor and Delivery     Medication List    STOP taking these medications        dimenhyDRINATE 50 MG tablet  Commonly known as:  DRAMAMINE      TAKE these medications        flintstones complete 60 MG chewable tablet  Chew 1 tablet by mouth daily.        SignedChristeen Douglas 12/23/2015, 2:56 PM

## 2015-12-24 LAB — HEPATITIS C ANTIBODY

## 2015-12-24 LAB — RPR: RPR Ser Ql: NONREACTIVE

## 2016-02-22 ENCOUNTER — Emergency Department
Admission: EM | Admit: 2016-02-22 | Discharge: 2016-02-22 | Disposition: A | Discharge status: Home / Self Care | Payer: Self-pay | Attending: Student | Admitting: Student

## 2016-02-22 ENCOUNTER — Encounter: Payer: Self-pay | Admitting: Emergency Medicine

## 2016-02-22 ENCOUNTER — Emergency Department: Payer: Self-pay

## 2016-02-22 DIAGNOSIS — R1110 Vomiting, unspecified: Secondary | ICD-10-CM | POA: Insufficient documentation

## 2016-02-22 DIAGNOSIS — R55 Syncope and collapse: Secondary | ICD-10-CM | POA: Insufficient documentation

## 2016-02-22 DIAGNOSIS — R197 Diarrhea, unspecified: Secondary | ICD-10-CM | POA: Insufficient documentation

## 2016-02-22 MED ORDER — SODIUM CHLORIDE 0.9 % IV BOLUS (SEPSIS): 1000.0000 mL | Freq: Once | INTRAVENOUS | Status: DC

## 2016-02-22 MED ORDER — ONDANSETRON HCL 4 MG/2ML IJ SOLN: 4.0000 mg | Freq: Once | INTRAMUSCULAR | Status: DC

## 2016-02-22 NOTE — ED Notes (Signed)
Pt arrived by EMS from home after having a syncopal episode. Pt is 2wks post par and received a transfusion after delivery. Was told by MD on thurs that her platelets were still low. Pt also states she takes Iron supplement.

## 2016-02-22 NOTE — ED Provider Notes (Signed)
[facility] Emergency Department Provider Note  ____________________________________________  Time seen: Approximately 10:14 AM  I have reviewed the triage vital signs and the nursing notes.   HISTORY  Chief Complaint Loss of Consciousness    HPI Anita Campbell is a 30 y.o. female with history of opiate abuse, 2 weeks post op status post C-section which was complicated by "low blood counts" which required transfusion, resents for evaluation of a syncopal episode which occurred suddenly just prior to arrival, now resolved, moderate severity. Patient reports that since last night she has had many episodes of nonbloody nonbilious emesis as well as nonbloody diarrhea and some epigastric pain. Today she stood up to walk to the bathroom to vomit and she felt lightheaded and fainted. She did not hit her head. She denies any neck pain. She has had subjective fevers. She has had mild non-foul-smelling lochia since her C-section. No headache, chest pain, difficulty breathing. No known sick contacts. She is being treated with "an antibiotic" for sinusitis and has had runny nose and cough.   Past Medical History  Diagnosis Date  . Anxiety     Patient Active Problem List   Diagnosis Date Noted  . Heroin withdrawal (HCC) 12/23/2015  . Opiate abuse, continuous 12/23/2015  . Pregnant and not yet delivered in third trimester 12/23/2015    Past Surgical History  Procedure Laterality Date  . Cholecystectomy    . Cesarean section      Current Outpatient Rx  Name  Route  Sig  Dispense  Refill  . flintstones complete (FLINTSTONES) 60 MG chewable tablet   Oral   Chew 1 tablet by mouth daily.           Allergies Review of patient's allergies indicates no known allergies.  History reviewed. No pertinent family history.  Social History Social History  Substance Use Topics  . Smoking status: Never Smoker   . Smokeless tobacco: None  . Alcohol Use: No     Review of Systems Constitutional: No fever/chills Eyes: No visual changes. ENT: No sore throat. Cardiovascular: Denies chest pain. Respiratory: Denies shortness of breath. Gastrointestinal: No abdominal pain.  + nausea, + vomiting.  + diarrhea.  No constipation. Genitourinary: Negative for dysuria. Musculoskeletal: Negative for back pain. Skin: Negative for rash. Neurological: Negative for headaches, focal weakness or numbness.  10-point ROS otherwise negative.  ____________________________________________   PHYSICAL EXAM:  VITAL SIGNS: ED Triage Vitals  Enc Vitals Group     BP 02/22/16 1013 130/82 mmHg     Pulse Rate 02/22/16 1013 53     Resp 02/22/16 1013 18     Temp 02/22/16 1013 98.5 F (36.9 C)     Temp Source 02/22/16 1013 Oral     SpO2 02/22/16 1013 100 %     Weight 02/22/16 1013 119 lb (53.978 kg)     Height 02/22/16 1013  (1.422 m)     Head Cir --      Peak Flow --      Pain Score --      Pain Loc --      Pain Edu? --      Excl. in GC? --     Constitutional: Alert and oriented. Nontoxic appearing and in no acute distress. Eyes: Conjunctivae are normal. PERRL. EOMI. Head: Atraumatic. Nose: +congestion/rhinnorhea. Mouth/Throat: Mucous membranes are moist.  Oropharynx non-erythematous. Neck: No stridor.  No cervical spine tenderness to palpation. Cardiovascular: Normal rate, regular rhythm. Grossly normal heart sounds.  Good  peripheral circulation. Respiratory: Normal respiratory effort.  No retractions. Slightly diminished breath sounds in the bases bilaterally. Gastrointestinal: Soft and nontender. Healing incision in the lower abdomen which is closed with Steri-Strips, no surrounding erythema, no purulent drainage, no fluctuance. No distention . No CVA tenderness. Genitourinary: deferred Musculoskeletal: No lower extremity tenderness nor edema.  No joint effusions. Neurologic:  Normal speech and language. No gross focal neurologic deficits are  appreciated. No gait instability. 5 out of 5 strength in bilateral upper and lower extremities, sensation intact to light touch throughout. Cranial nerves II through XII intact. Skin:  Skin is warm, dry and intact. No rash noted. Psychiatric: Mood and affect are normal. Speech and behavior are normal.  ____________________________________________   LABS (all labs ordered are listed, but only abnormal results are displayed)  Labs Reviewed  CBC WITH DIFFERENTIAL/PLATELET  COMPREHENSIVE METABOLIC PANEL  LIPASE, BLOOD  TROPONIN I  URINALYSIS COMPLETEWITH MICROSCOPIC ([facility] ONLY)  PROTIME-INR  APTT  TYPE AND SCREEN   ____________________________________________  EKG  ED ECG REPORT I, Gayla Doss, the attending physician, personally viewed and interpreted this ECG.   Date: 02/22/2016  EKG Time: 10:28  Rate: 47  Rhythm: junctional rhythm, bradycardic  Axis: nonr  Intervals:none  ST&T Change: No acute ST elevation.  ____________________________________________  RADIOLOGY  CXR FINDINGS: Normal heart size, mediastinal contours, and pulmonary vascularity.  LEFT lower lobe infiltrate most consistent with pneumonia though aspiration can cause a similar appearance.  Remaining lungs clear.  Mild central peribronchial thickening.  No pleural effusion or pneumothorax.  Bones unremarkable.  IMPRESSION: Bronchitic changes with LEFT lower lobe opacity most consistent with pneumonia as discussed above.   ____________________________________________   PROCEDURES  Procedure(s) performed: None  Critical Care performed: No  ____________________________________________   INITIAL IMPRESSION / ASSESSMENT AND PLAN / ED COURSE  Pertinent labs & imaging results that were available during my care of the patient were reviewed by me and considered in my medical decision making (see chart for details).  Anita Campbell is a 30 y.o. female with history of opiate abuse, 2  weeks post op status post C-section which was complicated by "low blood counts" which required transfusion, resents for evaluation of a syncopal episode which occurred suddenly just prior to arrival in the setting of multiple episodes of nonbloody nonbilious emesis and nonbloody diarrhea. On exam, she is nontoxic appearing and in no acute distress. Vital signs are stable, she is afebrile. Her exam is atraumatic, she has an intact neurological examination. Her surgical incision appears to be healing well. Suspect she may have had a vasovagal syncopal episode, she may have component of orthostasis given that the episode occurred soon after standing and was preceded by lightheadedness. We'll obtain screening labs, EKG, chest x-ray, urinalysis and treat her symptomatically with IV fluids and antiemetics. Reassess for disposition.  ----------------------------------------- 11:36 AM on 02/22/2016 ----------------------------------------- The patient was seen leaving the emergency department and would not stop though staff tried to speak with her. She only said "I'm going to [facility]" where she receives care routinely. I reviewed her chest x-ray which is concerning for pneumonia. I attempted to call her at the contact number provided however it appears that this phone number is disconnected. I called her mother who is listed as her emergency contact. I told her mother that her chest x-ray concerning for pneumonia and that she either needs to come back to our ER or be seen at the nearest emergency department for antibiotics, possibly IV antibiotics (if the  antibiotic that she is currently taking for sinusitis is supposed to appropriately cover for community-acquired pneumonia, she has likely failed outpatient treatment). Mother voices understanding of this and reports that she will attempt to relay the message. ____________________________________________   FINAL CLINICAL IMPRESSION(S) / ED DIAGNOSES  Final  diagnoses:  Vomiting and diarrhea  Syncope, unspecified syncope type      Gayla DossEryka A Kanin Lia, MD 02/22/16 1142

## 2016-02-22 NOTE — ED Notes (Signed)
Patient transported to X-ray 

## 2016-04-23 ENCOUNTER — Emergency Department
Admission: EM | Admit: 2016-04-23 | Discharge: 2016-04-23 | Disposition: A | Discharge status: Home / Self Care | Payer: Self-pay | Attending: Student | Admitting: Student

## 2016-04-23 ENCOUNTER — Encounter: Payer: Self-pay | Admitting: Emergency Medicine

## 2016-04-23 DIAGNOSIS — R197 Diarrhea, unspecified: Secondary | ICD-10-CM | POA: Insufficient documentation

## 2016-04-23 DIAGNOSIS — R112 Nausea with vomiting, unspecified: Secondary | ICD-10-CM

## 2016-04-23 DIAGNOSIS — I10 Essential (primary) hypertension: Secondary | ICD-10-CM | POA: Insufficient documentation

## 2016-04-23 LAB — CBC WITH DIFFERENTIAL/PLATELET
Basophils Absolute: 0 10*3/uL (ref 0–0.1)
Basophils Relative: 0 %
Eosinophils Absolute: 0 10*3/uL (ref 0–0.7)
Eosinophils Relative: 0 %
HCT: 39.8 % (ref 35.0–47.0)
HEMOGLOBIN: 13.2 g/dL (ref 12.0–16.0)
LYMPHS ABS: 0.7 10*3/uL — AB (ref 1.0–3.6)
Lymphocytes Relative: 7 %
MCH: 24.4 pg — AB (ref 26.0–34.0)
MCHC: 33.1 g/dL (ref 32.0–36.0)
MCV: 73.6 fL — AB (ref 80.0–100.0)
MONO ABS: 0.2 10*3/uL (ref 0.2–0.9)
NEUTROS ABS: 8.7 10*3/uL — AB (ref 1.4–6.5)
Neutrophils Relative %: 91 %
Platelets: 503 10*3/uL — ABNORMAL HIGH (ref 150–440)
RBC: 5.41 MIL/uL — ABNORMAL HIGH (ref 3.80–5.20)
RDW: 16.1 % — ABNORMAL HIGH (ref 11.5–14.5)
WBC: 9.7 10*3/uL (ref 3.6–11.0)

## 2016-04-23 LAB — URINALYSIS COMPLETE WITH MICROSCOPIC (ARMC ONLY)
BACTERIA UA: NONE SEEN
Bilirubin Urine: NEGATIVE
Glucose, UA: NEGATIVE mg/dL
HGB URINE DIPSTICK: NEGATIVE
Leukocytes, UA: NEGATIVE
NITRITE: NEGATIVE
PH: 7 (ref 5.0–8.0)
PROTEIN: NEGATIVE mg/dL
SPECIFIC GRAVITY, URINE: 1.017 (ref 1.005–1.030)

## 2016-04-23 LAB — COMPREHENSIVE METABOLIC PANEL
ALK PHOS: 86 U/L (ref 38–126)
ALT: 16 U/L (ref 14–54)
ANION GAP: 12 (ref 5–15)
AST: 22 U/L (ref 15–41)
Albumin: 4.8 g/dL (ref 3.5–5.0)
BILIRUBIN TOTAL: 0.6 mg/dL (ref 0.3–1.2)
BUN: 15 mg/dL (ref 6–20)
CALCIUM: 10.3 mg/dL (ref 8.9–10.3)
CO2: 22 mmol/L (ref 22–32)
CREATININE: 0.68 mg/dL (ref 0.44–1.00)
Chloride: 105 mmol/L (ref 101–111)
GLUCOSE: 130 mg/dL — AB (ref 65–99)
Potassium: 3.4 mmol/L — ABNORMAL LOW (ref 3.5–5.1)
Sodium: 139 mmol/L (ref 135–145)
Total Protein: 9.7 g/dL — ABNORMAL HIGH (ref 6.5–8.1)

## 2016-04-23 LAB — PREGNANCY, URINE: PREG TEST UR: NEGATIVE

## 2016-04-23 LAB — LIPASE, BLOOD: Lipase: 30 U/L (ref 11–51)

## 2016-04-23 MED ORDER — PROMETHAZINE HCL 25 MG/ML IJ SOLN
12.5000 mg | Freq: Once | INTRAMUSCULAR | Status: AC
  Administered 2016-04-23: 12.5 mg via INTRAVENOUS
  Filled 2016-04-23: qty 1

## 2016-04-23 MED ORDER — ONDANSETRON 4 MG PO TBDP
4.0000 mg | ORAL_TABLET | Freq: Once | ORAL | Status: AC
  Administered 2016-04-23: 4 mg via ORAL
  Filled 2016-04-23: qty 1

## 2016-04-23 MED ORDER — ONDANSETRON HCL 4 MG/2ML IJ SOLN
4.0000 mg | Freq: Once | INTRAMUSCULAR | Status: AC
  Administered 2016-04-23: 4 mg via INTRAVENOUS
  Filled 2016-04-23: qty 2

## 2016-04-23 MED ORDER — SODIUM CHLORIDE 0.9 % IV BOLUS (SEPSIS)
1000.0000 mL | Freq: Once | INTRAVENOUS | Status: AC
  Administered 2016-04-23: 1000 mL via INTRAVENOUS

## 2016-04-23 MED ORDER — ONDANSETRON 4 MG PO TBDP: 4.0000 mg | ORAL_TABLET | Freq: Three times a day (TID) | ORAL | Status: AC | PRN

## 2016-04-23 NOTE — ED Provider Notes (Signed)
[facility] Emergency Department Provider Note   ____________________________________________  Time seen: Approximately 10:20 AM  I have reviewed the triage vital signs and the nursing notes.   HISTORY  Chief Complaint Emesis and Diarrhea    HPI KABELLA CASSIDY is a 30 y.o. female with history of anxiety as well as heroin abuse who presents for evaluation of multiple episodes of nonbilious emesis and nonbloody diarrhea, sudden onset at 1 AM this morning, constant since onset, severe. She has had more than 10 episodes of each. Patient reports that she has seen small streaks of blood in her vomit at times but she denies any frank hematemesis. She has had abdominal cramping. No fevers or chills. No chest pain or difficulty breathing. No fainting. No known sick contacts. She ate "pizza and stuff" for dinner last night. She is postpartum after C-section/delivery of a baby boy on 02/03/16. She denies any abnormal vaginal bleeding or vaginal discharge.   Past Medical History  Diagnosis Date  . Anxiety     Patient Active Problem List   Diagnosis Date Noted  . Heroin withdrawal (HCC) 12/23/2015  . Opiate abuse, continuous 12/23/2015  . Pregnant and not yet delivered in third trimester 12/23/2015    Past Surgical History  Procedure Laterality Date  . Cholecystectomy    . Cesarean section      Current Outpatient Rx  Name  Route  Sig  Dispense  Refill  . flintstones complete (FLINTSTONES) 60 MG chewable tablet   Oral   Chew 1 tablet by mouth daily.         . ondansetron (ZOFRAN ODT) 4 MG disintegrating tablet   Oral   Take 1 tablet (4 mg total) by mouth every 8 (eight) hours as needed for nausea or vomiting.   12 tablet   0     Allergies Review of patient's allergies indicates no known allergies.  History reviewed. No pertinent family history.  Social History Social History  Substance Use Topics  . Smoking status: Never Smoker   .  Smokeless tobacco: None  . Alcohol Use: No    Review of Systems Constitutional: No fever/chills Eyes: No visual changes. ENT: No sore throat. Cardiovascular: Denies chest pain. Respiratory: Denies shortness of breath. Gastrointestinal: + abdominal cramping.  + nausea, + vomiting.  + diarrhea.  No constipation. Genitourinary: Negative for dysuria. Musculoskeletal: Negative for back pain. Skin: Negative for rash. Neurological: Negative for headaches, focal weakness or numbness.  10-point ROS otherwise negative.  ____________________________________________   PHYSICAL EXAM:  VITAL SIGNS: ED Triage Vitals  Enc Vitals Group     BP April 26, 2016 0915 142/104 mmHg     Pulse Rate 26-Apr-2016 0915 63     Resp 04/26/16 0915 20     Temp Apr 26, 2016 0915 97.7 F (36.5 C)     Temp Source 04-26-2016 0915 Oral     SpO2 04-26-2016 0915 97 %     Weight 04-26-2016 0915 119 lb (53.978 kg)     Height 04-26-16 0915 4\' 9"  (1.448 m)     Head Cir --      Peak Flow --      Pain Score 26-Apr-2016 0916 8     Pain Loc --      Pain Edu? --      Excl. in GC? --     Constitutional: Alert and oriented. Nontoxic appearing and in no acute distress. Eyes: Conjunctivae are normal. PERRL. EOMI. Head: Atraumatic. Nose: No congestion/rhinnorhea. Mouth/Throat: Mucous membranes are  moist.  Oropharynx non-erythematous. Neck: No stridor.  Supple without meningismus. Cardiovascular: Normal rate, regular rhythm. Grossly normal heart sounds.  Good peripheral circulation. Respiratory: Normal respiratory effort.  No retractions. Lungs CTAB. Gastrointestinal: Soft and nontender. No distention. Suprapubic C-section scar is well-healed without erythema, drainage, induration, fluctuance. Genitourinary: deferred Musculoskeletal: No lower extremity tenderness nor edema.  No joint effusions. Neurologic:  Normal speech and language. No gross focal neurologic deficits are appreciated. No gait instability. Skin:  Skin is warm, dry and  intact. No rash noted. Psychiatric: Mood and affect are normal. Speech and behavior are normal.  ____________________________________________   LABS (all labs ordered are listed, but only abnormal results are displayed)  Labs Reviewed  CBC WITH DIFFERENTIAL/PLATELET - Abnormal; Notable for the following:    RBC 5.41 (*)    MCV 73.6 (*)    MCH 24.4 (*)    RDW 16.1 (*)    Platelets 503 (*)    Neutro Abs 8.7 (*)    Lymphs Abs 0.7 (*)    All other components within normal limits  COMPREHENSIVE METABOLIC PANEL - Abnormal; Notable for the following:    Potassium 3.4 (*)    Glucose, Bld 130 (*)    Total Protein 9.7 (*)    All other components within normal limits  URINALYSIS COMPLETEWITH MICROSCOPIC ([facility] ONLY) - Abnormal; Notable for the following:    Color, Urine YELLOW (*)    APPearance CLEAR (*)    Ketones, ur 1+ (*)    Squamous Epithelial / LPF 0-5 (*)    All other components within normal limits  LIPASE, BLOOD  PREGNANCY, URINE   ____________________________________________  EKG  none ____________________________________________  RADIOLOGY  none ____________________________________________   PROCEDURES  Procedure(s) performed: None  Critical Care performed: No  ____________________________________________   INITIAL IMPRESSION / ASSESSMENT AND PLAN / ED COURSE  Pertinent labs & imaging results that were available during my care of the patient were reviewed by me and considered in my medical decision making (see chart for details).  TEYONNA PLAISTED is a 30 y.o. female with history of anxiety as well as heroin abuse who presents for evaluation of multiple episodes of nonbilious emesis and nonbloody diarrhea. Exam she is nontoxic appearing and in no acute distress. Her vital signs are stable and she is afebrile. I suspect the initial blood pressure 142/104 is spurious, will recheck. She has a benign abdominal exam. Likely viral syndrome. We'll obtain screening  labs, treat her symptomatically with IV fluids and antiemetics, reassess for disposition and need for advanced imaging.  ----------------------------------------- 1:51 PM on May 21, 2016 ----------------------------------------- I reviewed the patient's labs. CBC generally unremarkable with the exception of mild thrombocytosis, no thrombus at a pediatric, this is likely reactive. CMP unremarkable. Normal lipase.Negative pregnancy test. Urinalysis is not consistent with infection. The patient reports she feels much better at this time. She is tolerating by mouth intake without vomiting. She is requesting discharge at this time. Her blood pressure is elevated here at 168/96. I discussed this with Dr. Greggory Keen, on call for OB/GYN given that she is just over 11 weeks postpartum. He reports that at this point, this should be treated as chronic hypertension and postpartum preeclampsia is very unlikely as it rarely presents after 6 weeks post partum. I discussed this with the patient and we discussed need for close PCP follow-up for recheck of her blood pressure. She voices understanding. DC home with return precautions.  ____________________________________________   FINAL CLINICAL IMPRESSION(S) / ED DIAGNOSES  Final diagnoses:  Nausea, vomiting and diarrhea  Essential hypertension      NEW MEDICATIONS STARTED DURING THIS VISIT:  New Prescriptions   ONDANSETRON (ZOFRAN ODT) 4 MG DISINTEGRATING TABLET    Take 1 tablet (4 mg total) by mouth every 8 (eight) hours as needed for nausea or vomiting.     Note:  This document was prepared using Dragon voice recognition software and may include unintentional dictation errors.    Gayla DossEryka A Kathlene Yano, MD 2016-02-26 [phone]

## 2016-04-23 NOTE — ED Notes (Signed)
Pt to ed with c/o vomiting and diarrhea that started this am about 1 am.  Pt reports vomited about 15 times and diarrhea about 10 times.

## 2016-04-23 NOTE — Discharge Instructions (Signed)
You were seen in the emergency department for nausea, vomiting and diarrhea which may be due to a virus. Take Zofran as prescribed and try to stay well-hydrated. Your blood pressure was also elevated here. This needs to be rechecked by a primary care doctor within one week. Return immediately to the emergency department if you develop severe worsening symptoms including worsening vomiting, diarrhea, blood in vomit or stools, abdominal pain, fevers, chest pain, difficulty breathing, numbness or weakness, severe headache or for any other concerns.

## 2016-05-22 DEATH — deceased

## 2017-08-17 IMAGING — CR DG CHEST 2V
2 series · 2 of 2 positions shown · non-contrast
Comparison: 11/11/2011

CLINICAL DATA: Vomiting and syncope 2 weeks postpartum

EXAM:
CHEST  2 VIEW

[chest pa]
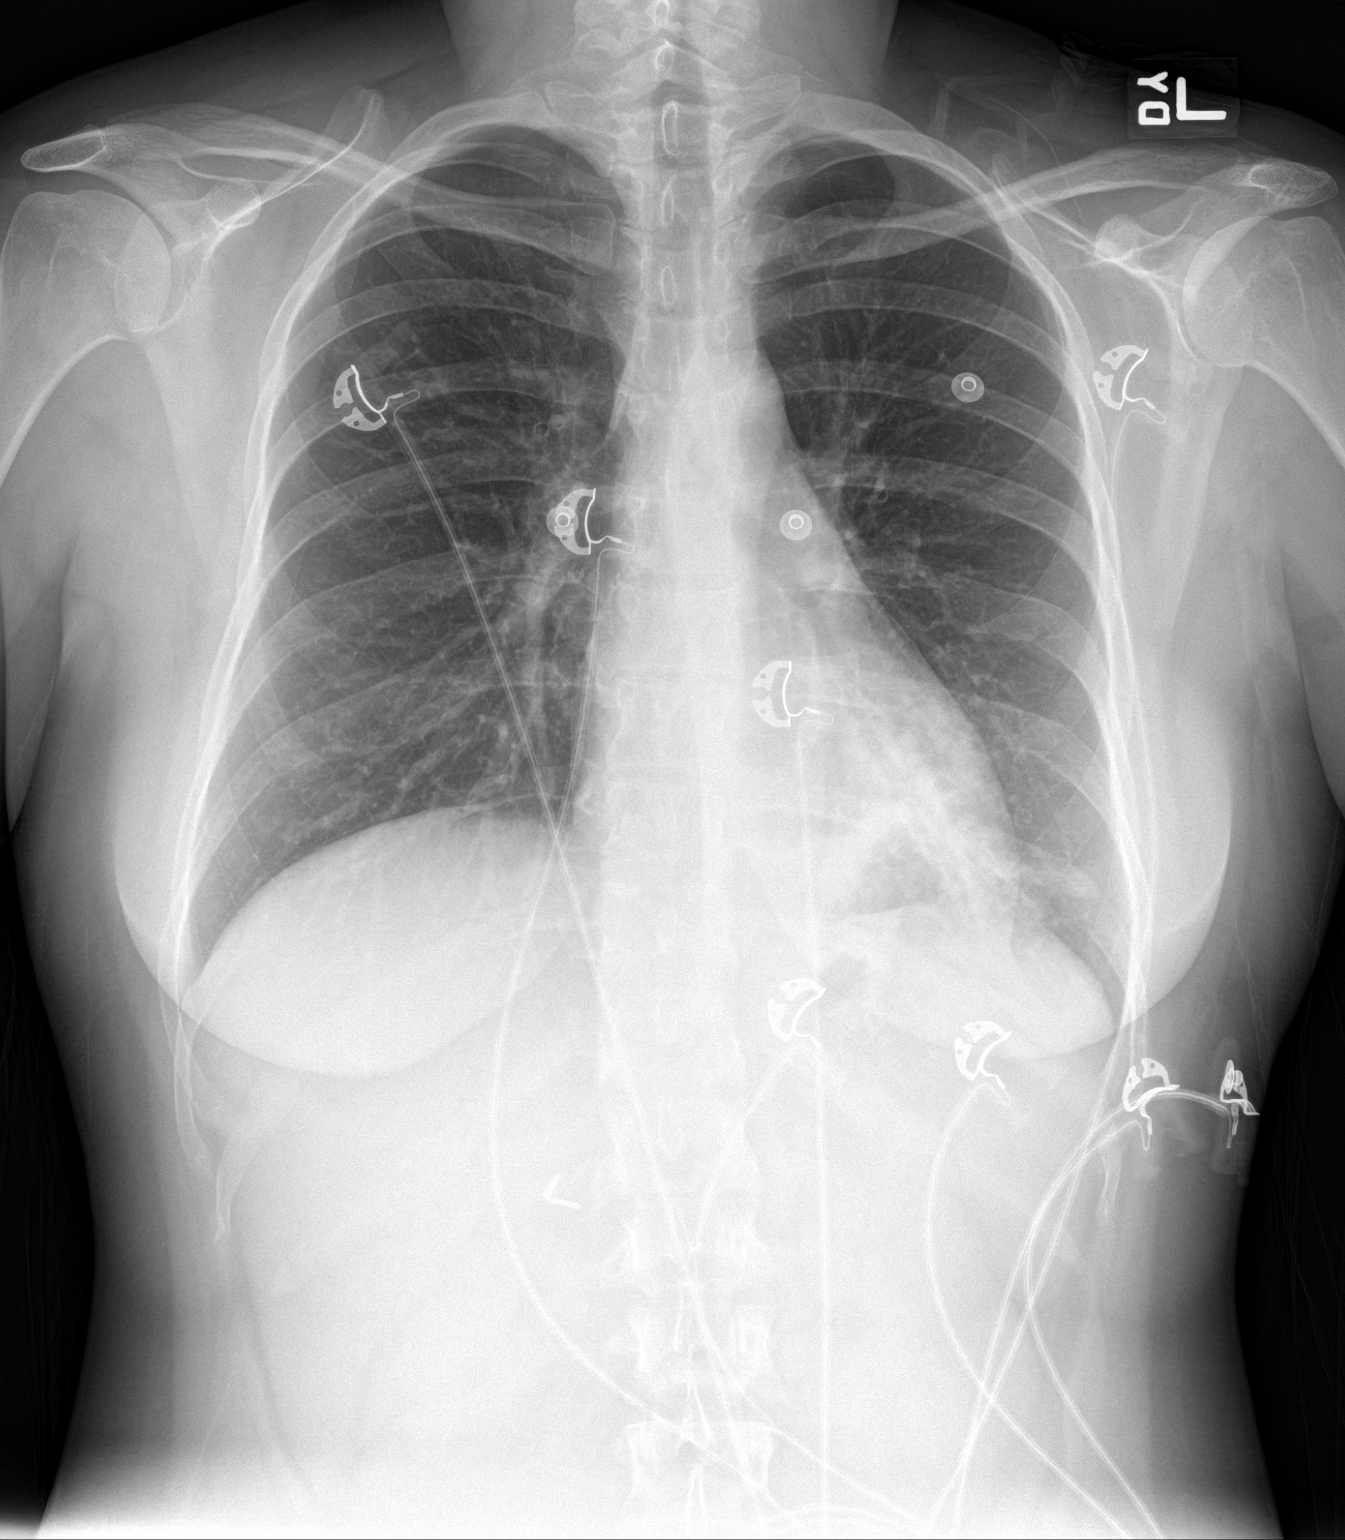

[chest lat]
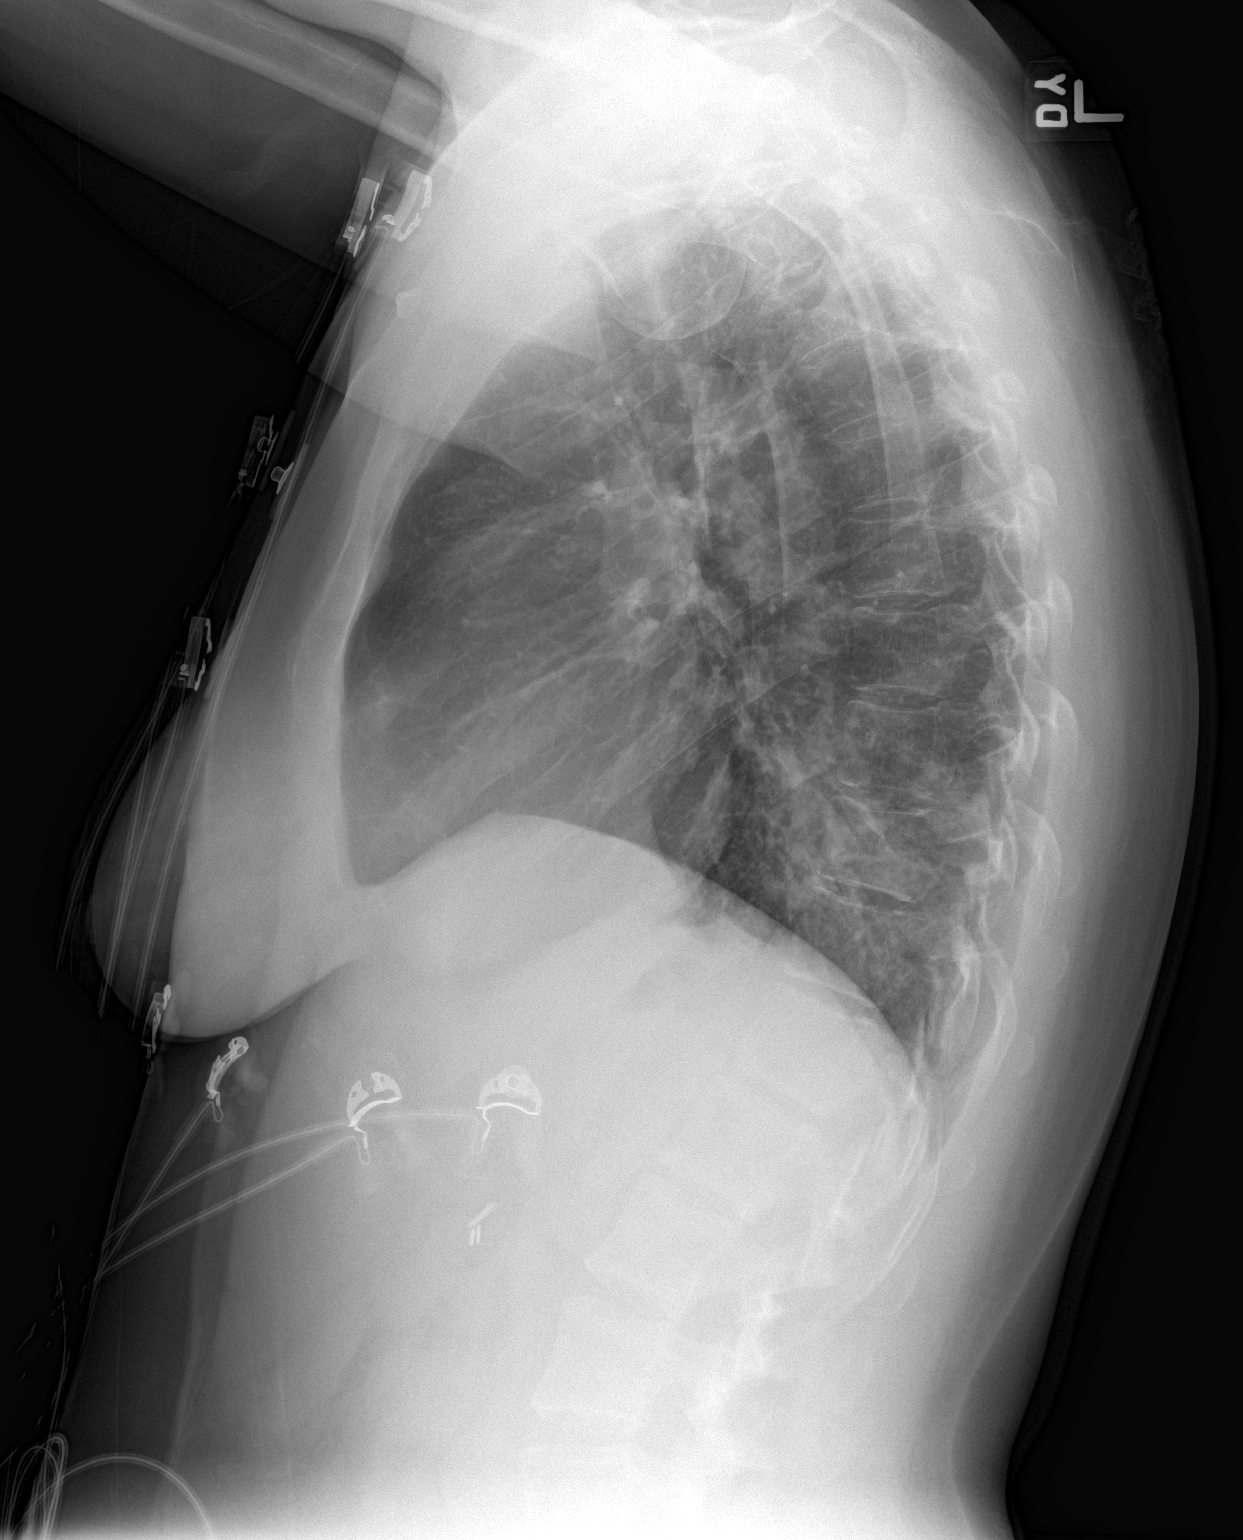

[2 of 2 positions shown; findings below may reference images not displayed]

FINDINGS: Normal heart size, mediastinal contours, and pulmonary vascularity.

LEFT lower lobe infiltrate most consistent with pneumonia though
aspiration can cause a similar appearance.

Remaining lungs clear.

Mild central peribronchial thickening.

No pleural effusion or pneumothorax.

Bones unremarkable.
IMPRESSION: Bronchitic changes with LEFT lower lobe opacity most consistent with
pneumonia as discussed above.
